# Patient Record
Sex: Female | Born: 1990 | Race: White | Hispanic: No | Marital: Single | Smoking: Never smoker
Health system: Southern US, Community
[De-identification: ages and names within clinical notes are randomized; demographics above are authoritative.]

## PROBLEM LIST (undated history)

## (undated) DIAGNOSIS — G43909 Migraine, unspecified, not intractable, without status migrainosus: Secondary | ICD-10-CM

## (undated) HISTORY — PX: ARTHROSCOPIC REPAIR ACL: SUR80

## (undated) HISTORY — PX: NO PAST SURGERIES: SHX2092

---
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
## 2013-03-05 ENCOUNTER — Ambulatory Visit: Payer: Managed Care, Other (non HMO) | Attending: Orthopedic Surgery | Admitting: Physical Therapy

## 2013-03-05 DIAGNOSIS — R262 Difficulty in walking, not elsewhere classified: Secondary | ICD-10-CM | POA: Insufficient documentation

## 2013-03-05 DIAGNOSIS — M25669 Stiffness of unspecified knee, not elsewhere classified: Secondary | ICD-10-CM | POA: Insufficient documentation

## 2013-03-05 DIAGNOSIS — M25569 Pain in unspecified knee: Secondary | ICD-10-CM | POA: Insufficient documentation

## 2013-03-05 DIAGNOSIS — J45909 Unspecified asthma, uncomplicated: Secondary | ICD-10-CM | POA: Insufficient documentation

## 2013-03-05 DIAGNOSIS — IMO0001 Reserved for inherently not codable concepts without codable children: Secondary | ICD-10-CM | POA: Insufficient documentation

## 2013-03-12 ENCOUNTER — Ambulatory Visit: Payer: Managed Care, Other (non HMO) | Attending: Orthopedic Surgery | Admitting: Physical Therapy

## 2013-03-12 DIAGNOSIS — M25669 Stiffness of unspecified knee, not elsewhere classified: Secondary | ICD-10-CM | POA: Insufficient documentation

## 2013-03-12 DIAGNOSIS — R262 Difficulty in walking, not elsewhere classified: Secondary | ICD-10-CM | POA: Insufficient documentation

## 2013-03-12 DIAGNOSIS — M25569 Pain in unspecified knee: Secondary | ICD-10-CM | POA: Insufficient documentation

## 2013-03-12 DIAGNOSIS — IMO0001 Reserved for inherently not codable concepts without codable children: Secondary | ICD-10-CM | POA: Insufficient documentation

## 2013-03-14 ENCOUNTER — Encounter: Payer: Managed Care, Other (non HMO) | Admitting: Physical Therapy

## 2013-05-13 ENCOUNTER — Ambulatory Visit: Payer: Managed Care, Other (non HMO) | Attending: Orthopedic Surgery | Admitting: Physical Therapy

## 2013-05-13 DIAGNOSIS — IMO0001 Reserved for inherently not codable concepts without codable children: Secondary | ICD-10-CM | POA: Insufficient documentation

## 2013-05-13 DIAGNOSIS — M25569 Pain in unspecified knee: Secondary | ICD-10-CM | POA: Insufficient documentation

## 2014-08-20 ENCOUNTER — Other Ambulatory Visit (HOSPITAL_COMMUNITY)
Admission: RE | Admit: 2014-08-20 | Discharge: 2014-08-20 | Disposition: A | Discharge status: Home / Self Care | Payer: Managed Care, Other (non HMO) | Source: Ambulatory Visit | Attending: Family Medicine | Admitting: Family Medicine

## 2014-08-20 ENCOUNTER — Ambulatory Visit (INDEPENDENT_AMBULATORY_CARE_PROVIDER_SITE_OTHER): Payer: Managed Care, Other (non HMO) | Admitting: Family Medicine

## 2014-08-20 ENCOUNTER — Encounter: Payer: Self-pay | Admitting: Family Medicine

## 2014-08-20 VITALS — BP 128/76 | HR 85 | Ht 62.25 in | Wt 231.0 lb

## 2014-08-20 DIAGNOSIS — Z01419 Encounter for gynecological examination (general) (routine) without abnormal findings: Secondary | ICD-10-CM | POA: Insufficient documentation

## 2014-08-20 DIAGNOSIS — Z23 Encounter for immunization: Secondary | ICD-10-CM

## 2014-08-20 DIAGNOSIS — Z0000 Encounter for general adult medical examination without abnormal findings: Secondary | ICD-10-CM

## 2014-08-20 DIAGNOSIS — Z111 Encounter for screening for respiratory tuberculosis: Secondary | ICD-10-CM

## 2014-08-20 NOTE — Progress Notes (Signed)
  Subjective:     Ruth Dorsey is a 24 y.o. female and is here for a comprehensive physical exam. The patient reports problems - cough > 1 months.  sometimes dyr and cometime wet. no fever or chills. Had a lot of congestion at the beginning. That is better. No ST or ear pain. No allergy sxs.  No meds for it.   History   Social History  . Marital Status: Single    Spouse Name: N/A  . Number of Children: N/A  . Years of Education: N/A   Occupational History  . college student     Social History Main Topics  . Smoking status: Never Smoker   . Smokeless tobacco: Not on file  . Alcohol Use: No  . Drug Use: No  . Sexual Activity: No   Other Topics Concern  . Not on file   Social History Narrative   In college.    Health Maintenance  Topic Date Due  . HIV Screening  10/19/2007  . PAP SMEAR  10/19/2010  . TETANUS/TDAP  10/19/2011  . INFLUENZA VACCINE  10/06/2014    The following portions of the patient's history were reviewed and updated as appropriate: allergies, current medications, past family history, past medical history, past social history, past surgical history and problem list.  Review of Systems A comprehensive review of systems was negative.   Objective:    There were no vitals taken for this visit. General appearance: alert, cooperative and appears stated age Head: Normocephalic, without obvious abnormality, atraumatic Eyes: conj clear, EOMI, PEERLA Ears: normal TM's and external ear canals both ears Nose: Nares normal. Septum midline. Mucosa normal. No drainage or sinus tenderness. Throat: lips, mucosa, and tongue normal; teeth and gums normal Neck: no adenopathy, no carotid bruit, no JVD, supple, symmetrical, trachea midline and thyroid not enlarged, symmetric, no tenderness/mass/nodules Back: symmetric, no curvature. ROM normal. No CVA tenderness. Lungs: clear to auscultation bilaterally Breasts: normal appearance, no masses or tenderness Heart: regular  rate and rhythm, S1, S2 normal, no murmur, click, rub or gallop Abdomen: soft, non-tender; bowel sounds normal; no masses,  no organomegaly Pelvic: cervix normal in appearance, external genitalia normal, no adnexal masses or tenderness, no cervical motion tenderness, rectovaginal septum normal, uterus normal size, shape, and consistency and vagina normal without discharge Extremities: extremities normal, atraumatic, no cyanosis or edema Pulses: 2+ and symmetric Skin: Skin color, texture, turgor normal. No rashes or lesions Lymph nodes: Cervical, supraclavicular, and axillary nodes normal. Neurologic: Alert and oriented X 3, normal strength and tone. Normal symmetric reflexes. Normal coordination and gait    Assessment:    Healthy female exam.      Plan:     See After Visit Summary for Counseling Recommendations   Keep up a regular exercise program and make sure you are eating a healthy diet Try to eat 4 servings of dairy a day, or if you are lactose intolerant take a calcium with vitamin D daily.  Your vaccines are up to date.   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  Will call with pap report.   Vision abnormal - recommend refer to optometry. She plans to schedule.

## 2014-08-21 LAB — CYTOLOGY - PAP

## 2014-08-22 LAB — TB SKIN TEST
INDURATION: 0 mm
TB SKIN TEST: NEGATIVE

## 2014-08-22 NOTE — Progress Notes (Signed)
Quick Note:  Call patient: Your Pap smear is normal. Repeat in 3 years. ______ 

## 2014-10-20 ENCOUNTER — Ambulatory Visit (INDEPENDENT_AMBULATORY_CARE_PROVIDER_SITE_OTHER): Payer: Managed Care, Other (non HMO) | Admitting: Family Medicine

## 2014-10-20 ENCOUNTER — Ambulatory Visit (INDEPENDENT_AMBULATORY_CARE_PROVIDER_SITE_OTHER): Payer: Managed Care, Other (non HMO)

## 2014-10-20 ENCOUNTER — Encounter: Payer: Self-pay | Admitting: Family Medicine

## 2014-10-20 VITALS — BP 138/64 | HR 90 | Ht 62.25 in | Wt 234.0 lb

## 2014-10-20 DIAGNOSIS — M25572 Pain in left ankle and joints of left foot: Secondary | ICD-10-CM

## 2014-10-20 DIAGNOSIS — S82892A Other fracture of left lower leg, initial encounter for closed fracture: Secondary | ICD-10-CM | POA: Diagnosis not present

## 2014-10-20 NOTE — Progress Notes (Signed)
   Subjective:    Patient ID: Ruth Dorsey, female    DOB: Jun 02, 1990, 24 y.o.   MRN: [id number]  HPI  Left ankle pain x 2 days after mis-stepped off a curb. Started swelling pretty quickly. Was able to bear wait initially but then got worse and then wasn't able to walk on it later.  Has been icing and elevated.  Taking Advil for pain relief. She is using some old crutches.   Review of Systems     Objective:   Physical Exam  Musculoskeletal:  Left ankle is swollen medially and laterally. 1+ edema. She's very tender over the posterior edge of the lateral malleolus. Mildly tender over the anterior edge of the lateral malleolus. Some discomfort with anterior drawer. Nontender over the top of the foot. Ankle range of motion is fairly normal though slightly decreased because of swelling. Strength is 5 out of 5 in all directions and great toe strength is 5 out of 5 with flexion and extension.   She does have a small scab on the top of her foot from the injury.       Assessment & Plan:  Left ankle pain-sprain versus fracture. Will get x-rays and call with results once available. For now continue to use crutches. Recommended Ace wrap for compression. Continue to elevate and ice as needed and will call with results once available.

## 2014-10-21 ENCOUNTER — Encounter: Payer: Self-pay | Admitting: Family Medicine

## 2014-10-21 ENCOUNTER — Ambulatory Visit (INDEPENDENT_AMBULATORY_CARE_PROVIDER_SITE_OTHER): Payer: Managed Care, Other (non HMO) | Admitting: Family Medicine

## 2014-10-21 DIAGNOSIS — S82892A Other fracture of left lower leg, initial encounter for closed fracture: Secondary | ICD-10-CM

## 2014-10-21 NOTE — Progress Notes (Signed)
   Subjective:    Patient ID: Ruth Dorsey, female    DOB: 12/16/1990, 24 y.o.   MRN: [id number]  HPI Patient was supposed to come in to be fitted for an ASO. But she says she's really not able to use her crutches and would prefer a boot. Ruth Dorsey was seen for boot placement on left foot.   Ruth Lecher, MD   Review of Systems     Objective:   Physical Exam        Assessment & Plan:  Boot fit well.

## 2014-10-31 ENCOUNTER — Ambulatory Visit: Payer: Managed Care, Other (non HMO) | Admitting: Family Medicine

## 2014-11-04 ENCOUNTER — Ambulatory Visit (INDEPENDENT_AMBULATORY_CARE_PROVIDER_SITE_OTHER): Payer: Managed Care, Other (non HMO)

## 2014-11-04 ENCOUNTER — Ambulatory Visit (INDEPENDENT_AMBULATORY_CARE_PROVIDER_SITE_OTHER): Payer: Managed Care, Other (non HMO) | Admitting: Family Medicine

## 2014-11-04 ENCOUNTER — Encounter: Payer: Self-pay | Admitting: Family Medicine

## 2014-11-04 VITALS — BP 120/79 | HR 73 | Wt 230.0 lb

## 2014-11-04 DIAGNOSIS — S82892G Other fracture of left lower leg, subsequent encounter for closed fracture with delayed healing: Secondary | ICD-10-CM | POA: Diagnosis not present

## 2014-11-04 DIAGNOSIS — M25572 Pain in left ankle and joints of left foot: Secondary | ICD-10-CM | POA: Diagnosis not present

## 2014-11-04 NOTE — Progress Notes (Signed)
   Subjective:    Patient ID: Ruth Dorsey, female    DOB: Sep 10, 1990, 24 y.o.   MRN: [id number]  HPI  F/u avulsion fracture of left lateral malleolus.  Has been in cam wwalker boot x 2 weeks. Went without one day and it became very painful and swollen. She's been wearing it since then. She is able to bear weight but just for short period of time. She has not needed to take anything for pain relief.   Review of Systems     Objective:   Physical Exam  Constitutional: She is oriented to person, place, and time. She appears well-developed and well-nourished.  HENT:  Head: Normocephalic and atraumatic.  Musculoskeletal:  Left ankle with some mild swelling laterally. She's tender over the posterior edge of the lateral malleolus. Nontender over the metatarsal heads. Fairly normal range of motion though some discomfort. She's very tender over the medial malleolus. Dorsal pedal pulses 2+. She does have some bruising at the top of the foot.  Neurological: She is alert and oriented to person, place, and time.  Skin: Skin is warm and dry.  Psychiatric: She has a normal mood and affect. Her behavior is normal.          Assessment & Plan:  Avulsion fracture, left lateral malleolus-she is still having significant pain and swelling. Will get repeat x-ray today since she's also extremely tender over the medial malleolus. We'll call results once available. If normal then expect that she will probably need to be in the boot for another 2-3 weeks.

## 2014-11-06 ENCOUNTER — Ambulatory Visit: Payer: Managed Care, Other (non HMO) | Admitting: Family Medicine

## 2014-12-17 ENCOUNTER — Encounter: Payer: Self-pay | Admitting: Family Medicine

## 2014-12-17 ENCOUNTER — Ambulatory Visit (INDEPENDENT_AMBULATORY_CARE_PROVIDER_SITE_OTHER): Payer: Managed Care, Other (non HMO) | Admitting: Family Medicine

## 2014-12-17 VITALS — BP 148/83 | HR 78 | Temp 99.2°F | Wt 229.0 lb

## 2014-12-17 DIAGNOSIS — J0301 Acute recurrent streptococcal tonsillitis: Secondary | ICD-10-CM | POA: Diagnosis not present

## 2014-12-17 DIAGNOSIS — J029 Acute pharyngitis, unspecified: Secondary | ICD-10-CM

## 2014-12-17 DIAGNOSIS — J039 Acute tonsillitis, unspecified: Secondary | ICD-10-CM | POA: Insufficient documentation

## 2014-12-17 LAB — POCT RAPID STREP A (OFFICE): RAPID STREP A SCREEN: NEGATIVE

## 2014-12-17 MED ORDER — AMOXICILLIN 500 MG PO CAPS: 500.0000 mg | ORAL_CAPSULE | Freq: Two times a day (BID) | ORAL | Status: DC

## 2014-12-17 NOTE — Patient Instructions (Signed)
Thank you for coming in today. Call or go to the emergency room if you get worse, have trouble breathing, have chest pains, or palpitations.   Tonsillitis Tonsillitis is an infection of the throat that causes the tonsils to become red, tender, and swollen. Tonsils are collections of lymphoid tissue at the back of the throat. Each tonsil has crevices (crypts). Tonsils help fight nose and throat infections and keep infection from spreading to other parts of the body for the first 18 months of life.  CAUSES Sudden (acute) tonsillitis is usually caused by infection with streptococcal bacteria. Long-lasting (chronic) tonsillitis occurs when the crypts of the tonsils become filled with pieces of food and bacteria, which makes it easy for the tonsils to become repeatedly infected. SYMPTOMS  Symptoms of tonsillitis include:  A sore throat, with possible difficulty swallowing.  White patches on the tonsils.  Fever.  Tiredness.  New episodes of snoring during sleep, when you did not snore before.  Small, foul-smelling, yellowish-white pieces of material (tonsilloliths) that you occasionally cough up or spit out. The tonsilloliths can also cause you to have bad breath. DIAGNOSIS Tonsillitis can be diagnosed through a physical exam. Diagnosis can be confirmed with the results of lab tests, including a throat culture. TREATMENT  The goals of tonsillitis treatment include the reduction of the severity and duration of symptoms and prevention of associated conditions. Symptoms of tonsillitis can be improved with the use of steroids to reduce the swelling. Tonsillitis caused by bacteria can be treated with antibiotic medicines. Usually, treatment with antibiotic medicines is started before the cause of the tonsillitis is known. However, if it is determined that the cause is not bacterial, antibiotic medicines will not treat the tonsillitis. If attacks of tonsillitis are severe and frequent, your health care  provider may recommend surgery to remove the tonsils (tonsillectomy). HOME CARE INSTRUCTIONS   Rest as much as possible and get plenty of sleep.  Drink plenty of fluids. While the throat is very sore, eat soft foods or liquids, such as sherbet, soups, or instant breakfast drinks.  Eat frozen ice pops.  Gargle with a warm or cold liquid to help soothe the throat. Mix 1/4 teaspoon of salt and 1/4 teaspoon of baking soda in 8 oz of water. SEEK MEDICAL CARE IF:   Large, tender lumps develop in your neck.  A rash develops.  A green, yellow-brown, or bloody substance is coughed up.  You are unable to swallow liquids or food for 24 hours.  You notice that only one of the tonsils is swollen. SEEK IMMEDIATE MEDICAL CARE IF:   You develop any new symptoms such as vomiting, severe headache, stiff neck, chest pain, or trouble breathing or swallowing.  You have severe throat pain along with drooling or voice changes.  You have severe pain, unrelieved with recommended medications.  You are unable to fully open the mouth.  You develop redness, swelling, or severe pain anywhere in the neck.  You have a fever. MAKE SURE YOU:   Understand these instructions.  Will watch your condition.  Will get help right away if you are not doing well or get worse.   This information is not intended to replace advice given to you by your health care provider. Make sure you discuss any questions you have with your health care provider.   Document Released: 12/01/2004 Document Revised: 03/14/2014 Document Reviewed: 08/10/2012 Elsevier Interactive Patient Education [employer].

## 2014-12-17 NOTE — Progress Notes (Signed)
Ruth Dorsey is a 24 y.o. female who presents to [facility]  today for sore throat and swollen tonsils. 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 She has tried OTC Tylenol for pain relief which she says helps. Additionally she states she thinks she has a had a low grade fever with some joint pain and sinus pressure. She denies chills, headache, cough, shortness of breath, chest pain, nausea, vomiting, diarrhea, constipation or urinary changes.    Past Medical History  Diagnosis Date  . Asthma    Past Surgical History  Procedure Laterality Date  . No past surgeries     Social History  Substance Use Topics  . Smoking status: Never Smoker   . Smokeless tobacco: Not on file  . Alcohol Use: No   family history includes Alcohol abuse in an other family member; Breast cancer in her maternal grandmother and paternal grandmother; Diabetes in her paternal aunt; Heart attack in her paternal grandfather; Hyperlipidemia in her father; Hypertension in her father; Stroke in her maternal uncle.  ROS as above Medications: Current Outpatient Prescriptions  Medication Sig Dispense Refill  . albuterol (PROVENTIL HFA;VENTOLIN HFA) 108 (90 BASE) MCG/ACT inhaler Inhale 2 puffs into the lungs every 6 (six) hours as needed.    Marland Kitchen amoxicillin (AMOXIL) 500 MG capsule Take 1 capsule (500 mg total) by mouth 2 (two) times daily. 20 capsule 0   No current facility-administered medications for this visit.   No Known Allergies   Exam:  BP 148/83 mmHg  Pulse 78  Temp(Src) 99.2 F (37.3 C) (Oral)  Wt 229 lb (103.874 kg)  SpO2 100% Gen: WDWN female in no acute distress. Non-toxic appearing HEENT: EOMI,  MMM. Tonsils mildly enlarged bilaterally with exudate present on the right side. No sinus tenderness on palpation. No  lymphadenopathy on palpation.  Lungs: Normal work of breathing. CTABL Heart: RRR no MRG Abd: NABS, Soft. Nondistended, Nontender Exts: Brisk capillary refill, warm and well perfused.   Results for orders placed or performed in visit on 12/17/14 (from the past 24 hour(s))  POCT rapid strep A     Status: None   Collection Time: 12/17/14  3:40 PM  Result Value Ref Range   Rapid Strep A Screen Negative Negative    Assessment: 1. Tonsillitis likely caused by streptococcus based on patient h/o of symptoms, patient proximity to ill contacts and physical exam findings of mild tonsillar enlargement with exudate without fever, lymphadenopathy, cough or adventitious lung sounds. Strep A r/o with rapid strep screen but suspect another strain of streptococcus is causative agent.   Plan: 1. Patient prescribed amoxicillin 500 mg capsules PO BID x 10 days and instructed to maintain at home regimen of tylenol for pain control. Warning signs and syndromes discussed. Patient conveyed understanding. See discharge instructions.

## 2015-01-07 ENCOUNTER — Ambulatory Visit (INDEPENDENT_AMBULATORY_CARE_PROVIDER_SITE_OTHER): Payer: Managed Care, Other (non HMO) | Admitting: Osteopathic Medicine

## 2015-01-07 VITALS — BP 133/65 | HR 67 | Temp 98.4°F | Wt 232.8 lb

## 2015-01-07 DIAGNOSIS — J029 Acute pharyngitis, unspecified: Secondary | ICD-10-CM

## 2015-01-07 MED ORDER — AMOXICILLIN 500 MG PO CAPS: 500.0000 mg | ORAL_CAPSULE | Freq: Two times a day (BID) | ORAL | Status: DC

## 2015-01-07 NOTE — Progress Notes (Signed)
HPI: Ruth Dorsey is a 24 y.o. female who presents to [facility]  today for chief complaint of:  Chief Complaint  Patient presents with  . Sore Throat  . Fatigue    . Location: THROAT . Quality: sore, scratchy . Severity: worse earlier but better today (made appt 2 days ago) . Duration: 3 days . Timing: constant . Context: recent amox tx for pharyngitis, seems to have strep frequently, . Modifying factors: no OTC med . Assoc signs/symptoms: no fever/chills   Past medical, social and family history reviewed: Past Medical History  Diagnosis Date  . Asthma    Past Surgical History  Procedure Laterality Date  . No past surgeries     Social History  Substance Use Topics  . Smoking status: Never Smoker   . Smokeless tobacco: Not on file  . Alcohol Use: No   Family History  Problem Relation Age of Onset  . Alcohol abuse      parent  . Heart attack Paternal Grandfather   . Diabetes Paternal Aunt   . Hyperlipidemia Father   . Hypertension Father   . Stroke Maternal Uncle   . Breast cancer Paternal Grandmother   . Breast cancer Maternal Grandmother     Current Outpatient Prescriptions  Medication Sig Dispense Refill  . albuterol (PROVENTIL HFA;VENTOLIN HFA) 108 (90 BASE) MCG/ACT inhaler Inhale 2 puffs into the lungs every 6 (six) hours as needed.    Marland Kitchen amoxicillin (AMOXIL) 500 MG capsule Take 1 capsule (500 mg total) by mouth 2 (two) times daily. (Patient not taking: Reported on 01/07/2015) 20 capsule 0   No current facility-administered medications for this visit.   No Known Allergies    Review of Systems: CONSTITUTIONAL:  No  fever, no chills, No  unintentional weight changes HEAD/EYES/EARS/NOSE/THROAT: No headache, no vision change, no hearing change, Yes  sore throat CARDIAC: No chest pain, no pressure/palpitations, no orthopnea RESPIRATORY: Yes  cough, No  shortness of breath/wheeze GASTROINTESTINAL: No nausea, no vomiting,  no abdominal pain,   Exam:  BP 133/65 mmHg  Pulse 67  Temp(Src) 98.4 F (36.9 C)  Wt 232 lb 12 oz (105.575 kg) Constitutional: VSS, see above. General Appearance: alert, well-developed, well-nourished, NAD Eyes: Normal lids and conjunctive, non-icteric sclera, PERRLA Ears, Nose, Mouth, Throat: Normal external inspection ears/nares/mouth/lips/gums, TM normal bilaterally, MMM, posterior pharynx Yes  erythema No  exudate Neck: No masses, trachea midline. No thyroid enlargement/tenderness/mass appreciated. No lymphadenopathy, tonsils enlarged Respiratory: Normal respiratory effort. no wheeze, no rhonchi, no rales Cardiovascular: S1/S2 normal, no murmur, no rub/gallop auscultated. RRR.    No results found for this or any previous visit (from the past 72 hour(s)).    ASSESSMENT/PLAN:  Acute pharyngitis, unspecified etiology - Plan: amoxicillin (AMOXIL) 500 MG capsule, Upper Respiratory Culture - unlikely strep on clinical exam, pt is feeling better today, but given recurrence will obtain culture to confirm no uncommon bacterial cause, consider referral to ENT if recurs again. Pt to fill abx if no better by the weekend.    Return if symptoms worsen or fail to improve.

## 2015-01-07 NOTE — Patient Instructions (Signed)
START ANTIBIOTICS IF YOU AREN'T FEELING ANY BETTER BY THE WEEKEND, OTHERWISE SUPPORTIVE CARE, VER-THE-COUNTER TREATMENT WITH DECONGESTANTS, ANTIHISTAMINE, HONEY OR LOZENGES FOR SORE THROAT/COUGHING.WE WILL CALL WITH RESULTS OF CULTURE, BUT THIS MAY TAKE SEVERAL DAYS FOR A RESULT TO BE REPORTED. LET us KNOW IF THERE IS ANYTHING ELSE WE CAN DO TO HELP!

## 2015-01-10 LAB — CULTURE, UPPER RESPIRATORY: Organism ID, Bacteria: NORMAL

## 2015-04-13 ENCOUNTER — Encounter: Payer: Self-pay | Admitting: Family Medicine

## 2015-04-13 ENCOUNTER — Ambulatory Visit (INDEPENDENT_AMBULATORY_CARE_PROVIDER_SITE_OTHER): Payer: Managed Care, Other (non HMO) | Admitting: Family Medicine

## 2015-04-13 VITALS — BP 139/71 | HR 103 | Temp 98.3°F | Wt 237.2 lb

## 2015-04-13 DIAGNOSIS — J029 Acute pharyngitis, unspecified: Secondary | ICD-10-CM

## 2015-04-13 DIAGNOSIS — J020 Streptococcal pharyngitis: Secondary | ICD-10-CM | POA: Diagnosis not present

## 2015-04-13 LAB — POCT RAPID STREP A (OFFICE): RAPID STREP A SCREEN: POSITIVE — AB

## 2015-04-13 MED ORDER — PENICILLIN G BENZATHINE 1200000 UNIT/2ML IM SUSP
1.2000 10*6.[IU] | Freq: Once | INTRAMUSCULAR | Status: AC
  Administered 2015-04-13: 1.2 10*6.[IU] via INTRAMUSCULAR

## 2015-04-13 NOTE — Patient Instructions (Signed)

## 2015-04-13 NOTE — Progress Notes (Signed)
   Subjective:    Patient ID: Ruth Dorsey, female    DOB: 1990-11-30, 25 y.o.   MRN: [id number]  HPI ST x 1 days. Painful to swallow. Feels swollen. Has a mild cough.  Having some post nasal drip.  Mild HA.  No fever today.  + exposure to strep throat.     Review of Systems     Objective:   Physical Exam  Constitutional: She is oriented to person, place, and time. She appears well-developed and well-nourished.  HENT:  Head: Normocephalic and atraumatic.  Right Ear: External ear normal.  Left Ear: External ear normal.  Nose: Nose normal.  Mouth/Throat: Oropharynx is clear and moist.  TMs and canals are clear.   Eyes: Conjunctivae and EOM are normal. Pupils are equal, round, and reactive to light.  Neck: Neck supple. No thyromegaly present.  Cardiovascular: Normal rate, regular rhythm and normal heart sounds.   Pulmonary/Chest: Effort normal and breath sounds normal. She has no wheezes.  Lymphadenopathy:    She has no cervical adenopathy.  Neurological: She is alert and oriented to person, place, and time.  Skin: Skin is warm and dry.  Psychiatric: She has a normal mood and affect. Her behavior is normal.          Assessment & Plan:  Pharyngitis - step is positive. Will give 1.2 mill unit Bicillin.  Get new toothbrush in 48-72 hours. F/U if not better in 5 days.

## 2015-06-05 ENCOUNTER — Encounter: Payer: Self-pay | Admitting: Family Medicine

## 2015-06-05 ENCOUNTER — Ambulatory Visit (INDEPENDENT_AMBULATORY_CARE_PROVIDER_SITE_OTHER): Payer: Managed Care, Other (non HMO) | Admitting: Family Medicine

## 2015-06-05 VITALS — BP 144/81 | HR 73 | Wt 237.0 lb

## 2015-06-05 DIAGNOSIS — R51 Headache: Secondary | ICD-10-CM | POA: Diagnosis not present

## 2015-06-05 DIAGNOSIS — R519 Headache, unspecified: Secondary | ICD-10-CM

## 2015-06-05 MED ORDER — PROPRANOLOL HCL 20 MG PO TABS: 20.0000 mg | ORAL_TABLET | Freq: Three times a day (TID) | ORAL | Status: DC

## 2015-06-05 NOTE — Progress Notes (Signed)
CC: Ruth Dorsey is a 25 y.o. female is here for Headache   Subjective: HPI:  Complains of daily headache present for the past month both at work and at home. It's localized in the right temple, constant without any pulsatile quality. Nothing seems to make it worse and nothing has made it better. It fluctuates throughout the day for mild to moderate degree. No benefit from ibuprofen or Tylenol. She denies photophobia and nausea or vision disturbance. She denies fevers, chills or nasal congestion. No confusion, head trauma, coordination difficulty or any other motor or sensory disturbances.   Review Of Systems Outlined In HPI  Past Medical History  Diagnosis Date  . Asthma     Past Surgical History  Procedure Laterality Date  . No past surgeries     Family History  Problem Relation Age of Onset  . Alcohol abuse      parent  . Heart attack Paternal Grandfather   . Diabetes Paternal Aunt   . Hyperlipidemia Father   . Hypertension Father   . Stroke Maternal Uncle   . Breast cancer Paternal Grandmother   . Breast cancer Maternal Grandmother     Social History   Social History  . Marital Status: Single    Spouse Name: N/A  . Number of Children: N/A  . Years of Education: N/A   Occupational History  . Teacher      [employer].     Social History Main Topics  . Smoking status: Never Smoker   . Smokeless tobacco: Not on file  . Alcohol Use: No  . Drug Use: 2.00 per week  . Sexual Activity: Not Currently   Other Topics Concern  . Not on file   Social History Narrative   Walks her dog.no regular caffeine.       Objective: BP 144/81 mmHg  Pulse 73  Wt 237 lb (107.502 kg)  General: Alert and Oriented, No Acute Distress HEENT: Pupils equal, round, reactive to light. Conjunctivae clear.  External ears unremarkable, canals clear with intact TMs with appropriate landmarks.  Middle ear appears open without effusion. Pink inferior turbinates.  Moist mucous membranes,  pharynx without inflammation nor lesions.  Neck supple without palpable lymphadenopathy nor abnormal masses. Neuro: CN II-XII grossly intact, full strength/rom of all four extremities, C5/L4/S1 DTRs 2/4 bilaterally, gait normal, rapid alternating movements normal, heel-shin test normal, Rhomberg normal. Lungs: Clear comfortable work of breathing  Cardiac: Regular rate and rhythm   Extremities: No peripheral edema.  Strong peripheral pulses.  Mental Status: No depression, anxiety, nor agitation. Skin: Warm and dry.  Assessment & Plan: Ruth Dorsey was seen today for headache.  Diagnoses and all orders for this visit:  Acute nonintractable headache, unspecified headache type -     propranolol (INDERAL) 20 MG tablet; Take 1 tablet (20 mg total) by mouth 3 (three) times daily.   Given elevated blood pressure trial of propranolol and if no better after one week please call me. If beneficial and follow-up with PCP within one month. Keeping pseudotumor cerebri on the differential however her presentation is currently lacking additional typical features.  Return if symptoms worsen or fail to improve.

## 2016-05-02 ENCOUNTER — Telehealth: Payer: Self-pay

## 2016-05-02 NOTE — Telephone Encounter (Signed)
She needs an appt.  This is not normal.  If happens again then needs to go to the ED.

## 2016-05-03 ENCOUNTER — Encounter: Payer: Self-pay | Admitting: Family Medicine

## 2016-05-03 ENCOUNTER — Other Ambulatory Visit: Payer: Self-pay | Admitting: Family Medicine

## 2016-05-03 ENCOUNTER — Ambulatory Visit (INDEPENDENT_AMBULATORY_CARE_PROVIDER_SITE_OTHER): Payer: Managed Care, Other (non HMO) | Admitting: Family Medicine

## 2016-05-03 VITALS — BP 130/71 | HR 76 | Ht 62.25 in | Wt 238.0 lb

## 2016-05-03 DIAGNOSIS — R5383 Other fatigue: Secondary | ICD-10-CM

## 2016-05-03 DIAGNOSIS — R110 Nausea: Secondary | ICD-10-CM | POA: Diagnosis not present

## 2016-05-03 DIAGNOSIS — R55 Syncope and collapse: Secondary | ICD-10-CM

## 2016-05-03 DIAGNOSIS — D473 Essential (hemorrhagic) thrombocythemia: Secondary | ICD-10-CM | POA: Diagnosis not present

## 2016-05-03 DIAGNOSIS — R413 Other amnesia: Secondary | ICD-10-CM

## 2016-05-03 DIAGNOSIS — E611 Iron deficiency: Secondary | ICD-10-CM

## 2016-05-03 DIAGNOSIS — R7989 Other specified abnormal findings of blood chemistry: Secondary | ICD-10-CM

## 2016-05-03 LAB — CBC WITH DIFFERENTIAL/PLATELET
Basophils Absolute: 0 {cells}/uL (ref 0–200)
Basophils Relative: 0 %
Eosinophils Absolute: 240 {cells}/uL (ref 15–500)
Eosinophils Relative: 2 %
HCT: 40.4 % (ref 35.0–45.0)
Hemoglobin: 13 g/dL (ref 11.7–15.5)
Lymphocytes Relative: 27 %
Lymphs Abs: 3240 {cells}/uL (ref 850–3900)
MCH: 25.4 pg — ABNORMAL LOW (ref 27.0–33.0)
MCHC: 32.2 g/dL (ref 32.0–36.0)
MCV: 79.1 fL — ABNORMAL LOW (ref 80.0–100.0)
MPV: 9.6 fL (ref 7.5–12.5)
Monocytes Absolute: 840 {cells}/uL (ref 200–950)
Monocytes Relative: 7 %
Neutro Abs: 7680 {cells}/uL (ref 1500–7800)
Neutrophils Relative %: 64 %
Platelets: 582 K/uL — ABNORMAL HIGH (ref 140–400)
RBC: 5.11 MIL/uL — ABNORMAL HIGH (ref 3.80–5.10)
RDW: 15.1 % — ABNORMAL HIGH (ref 11.0–15.0)
WBC: 12 K/uL — ABNORMAL HIGH (ref 3.8–10.8)

## 2016-05-03 NOTE — Progress Notes (Signed)
Subjective:    CC: Syncope  HPI:  26 yo female comes in today for a recent ED visit for syncope.  Mom called yesterday, "Fumi was found unconscious at school this morning around 7:15. Her roommate called 911 and she was taken to [facility]. They ran numerous test, and she was talking to mom at the hospital and acting "normal". She brought her home and Conni slept for about 4 hours. When she woke up, she thought it was Sunday, and did not remember anything".  I had asked mom to bring her in. The temporary memory loss was concerning.   Patient says her roommate found her around 6:30 AM.  She actually also reports amnesia. She says she doesn't remember anything from the night before this happened up until about 4:30 in the afternoon yesterday. She does report that she's been experiencing some nausea without vomiting, fatigue and headache for about a week before this happened. She has not been sick recently. The headache she describes as a pressure sensation around her whole head with a little bit more pressure on the right side of her head. She denies any urinary symptoms. No blood in the urine or stool. She's not had any abdominal pain or discomfort. She's actually been eating normally. No fevers chills or sweats.    Labs, CT, shoulder xray and chest xray done at [facility].   They did x-ray her right shoulder because she complained of pain in the emergency room. Today she says it is a little bit sore. She also has a small bruise on her mid chest where her roommate was trying to do CPR on her.  Past medical history, Surgical history, Family history not pertinant except as noted below, Social history, Allergies, and medications have been entered into the medical record, reviewed, and corrections made.   Review of Systems: No fevers, chills, night sweats, weight loss, chest pain, or shortness of breath.   Objective:    General: Well Developed, well nourished, and in no acute distress.   Neuro: Alert and oriented x3, extra-ocular muscles intact, sensation grossly intact.  HEENT: Normocephalic, atraumatic  Skin: Warm and dry, no rashes. Cardiac: Regular rate and rhythm, no murmurs rubs or gallops, no lower extremity edema.  Respiratory: Clear to auscultation bilaterally. Not using accessory muscles, speaking in full sentences. Abd: Soft, just mildly tender left of the umbilicus. No rebound or guarding. No masses.   Impression and Recommendations:    Syncope - Unclear etiology at this point. She did have a normal head CT. I am concerned that she has a maternal uncle who had a stroke in his 74s and another maternal uncle who has history of repeat DVTs. I'm going to go ahead and do a hypercoagulable panel on her. I also like to get her scheduled for an echocardiogram just to rule out hypertrophic cardiomyopathy. Would also like to get a bubble study done  Elevated platelet levels-we'll recheck those today.  Right shoulder pain-most likely just contusion from the fall itself.  Call if not improving.  Nausea-unclear etiology. She's not really had any vomiting with it and does have a little bit of tenderness on abdominal exam but no red flags. And have her do some stool cards just to make sure there is no sign of blood loss through the stool. We'll also do a urinalysis today.

## 2016-05-03 NOTE — Patient Instructions (Signed)
Do not drive for one week.

## 2016-05-04 ENCOUNTER — Other Ambulatory Visit: Payer: Self-pay | Admitting: Family Medicine

## 2016-05-04 DIAGNOSIS — R82998 Other abnormal findings in urine: Secondary | ICD-10-CM

## 2016-05-04 LAB — URINALYSIS
Bilirubin Urine: NEGATIVE
Glucose, UA: NEGATIVE
Hgb urine dipstick: NEGATIVE
KETONES UR: NEGATIVE
NITRITE: NEGATIVE
Protein, ur: NEGATIVE
SPECIFIC GRAVITY, URINE: 1.023 (ref 1.001–1.035)
pH: 6 (ref 5.0–8.0)

## 2016-05-04 LAB — IRON AND TIBC
%SAT: 6 % — ABNORMAL LOW (ref 11–50)
IRON: 24 ug/dL — AB (ref 40–190)
TIBC: 386 ug/dL (ref 250–450)
UIBC: 362 ug/dL (ref 125–400)

## 2016-05-04 LAB — VITAMIN B12: VITAMIN B 12: 318 pg/mL (ref 200–1100)

## 2016-05-04 LAB — TSH: TSH: 1.62 m[IU]/L

## 2016-05-04 LAB — FERRITIN: FERRITIN: 26 ng/mL (ref 10–154)

## 2016-05-04 NOTE — Telephone Encounter (Signed)
Patient evaluated in office 

## 2016-05-05 ENCOUNTER — Telehealth: Payer: Self-pay | Admitting: Family Medicine

## 2016-05-05 LAB — POC HEMOCCULT BLD/STL (HOME/3-CARD/SCREEN)
Card #2 Fecal Occult Blod, POC: NEGATIVE
FECAL OCCULT BLD: NEGATIVE
Fecal Occult Blood, POC: NEGATIVE

## 2016-05-05 NOTE — Telephone Encounter (Signed)
Received information from [facility], unable to run Hypercoagulable panel, comprehensive due to incorrect frozen specimen tube sent. Will route to PCP.   Did place complaint with Ulice Dash from [place], advised we have had 4 similar [facility] errors this week.

## 2016-05-05 NOTE — Addendum Note (Signed)
Addended by: Terance Hart on: 05/05/2016 04:17 PM   Modules accepted: Orders

## 2016-05-06 LAB — URINE CULTURE: ORGANISM ID, BACTERIA: NO GROWTH

## 2016-05-10 ENCOUNTER — Other Ambulatory Visit: Payer: Self-pay | Admitting: *Deleted

## 2016-05-10 DIAGNOSIS — E611 Iron deficiency: Secondary | ICD-10-CM

## 2016-05-10 NOTE — Addendum Note (Signed)
Addended by: Teddy Spike on: 05/10/2016 05:41 PM   Modules accepted: Orders

## 2016-05-11 ENCOUNTER — Ambulatory Visit (HOSPITAL_COMMUNITY)
Admission: RE | Admit: 2016-05-11 | Discharge: 2016-05-11 | Disposition: A | Discharge status: Home / Self Care | Payer: Managed Care, Other (non HMO) | Source: Ambulatory Visit | Attending: Family Medicine | Admitting: Family Medicine

## 2016-05-11 DIAGNOSIS — R5383 Other fatigue: Secondary | ICD-10-CM | POA: Insufficient documentation

## 2016-05-11 DIAGNOSIS — I351 Nonrheumatic aortic (valve) insufficiency: Secondary | ICD-10-CM | POA: Diagnosis not present

## 2016-05-11 DIAGNOSIS — R110 Nausea: Secondary | ICD-10-CM | POA: Insufficient documentation

## 2016-05-11 DIAGNOSIS — R55 Syncope and collapse: Secondary | ICD-10-CM | POA: Diagnosis not present

## 2016-05-11 LAB — ECHOCARDIOGRAM COMPLETE BUBBLE STUDY
AVLVOTPG: 11 mmHg
AVPHT: 423 ms
CHL CUP DOP CALC LVOT VTI: 30.3 cm
E decel time: 162 msec
E/e' ratio: 6.91
FS: 36 % (ref 28–44)
IVS/LV PW RATIO, ED: 0.95
LA diam index: 1.84 cm/m2
LA vol A4C: 40.7 ml
LA vol: 47.6 mL
LASIZE: 38 mm
LAVOLIN: 23.1 mL/m2
LDCA: 2.84 cm2
LEFT ATRIUM END SYS DIAM: 38 mm
LV E/e'average: 6.91
LV PW d: 8.25 mm — AB (ref 0.6–1.1)
LV TDI E'MEDIAL: 12.3
LVEEMED: 6.91
LVELAT: 17.8 cm/s
LVOT SV: 86 mL
LVOT peak vel: 169 cm/s
LVOTD: 19 mm
MV Dec: 162
MV Peak grad: 6 mmHg
MV pk E vel: 123 m/s
MVPKAVEL: 66.7 m/s
TAPSE: 26.4 mm
TDI e' lateral: 17.8

## 2016-05-11 NOTE — Progress Notes (Signed)
  Echocardiogram 2D Echocardiogram has been performed with bubble study.  Ruth Dorsey 05/11/2016, 4:07 PM

## 2016-05-17 LAB — HYPERCOAGULABLE PANEL, COMPREHENSIVE
Anticardiolipin IgA: <11 [APL'U]
Anticardiolipin IgG: <14 [GPL'U]
Anticardiolipin IgM: <12 [MPL'U]
Beta-2 Glyco I IgG: 9 SGU (ref <=20)
Beta-2-Glycoprotein I IgM: <9 SMU (ref <=20)

## 2016-05-17 LAB — RFX PTT-LA W/RFX TO HEX PHASE CONF

## 2016-05-17 LAB — RFX DRVVT SCR W/RFLX CONF 1:1 MIX

## 2016-05-18 ENCOUNTER — Observation Stay (HOSPITAL_COMMUNITY)
Admission: EM | Admit: 2016-05-18 | Discharge: 2016-05-19 | Disposition: A | Discharge status: Home / Self Care | Payer: Managed Care, Other (non HMO) | Attending: Internal Medicine | Admitting: Internal Medicine

## 2016-05-18 ENCOUNTER — Observation Stay (HOSPITAL_COMMUNITY): Payer: Managed Care, Other (non HMO)

## 2016-05-18 ENCOUNTER — Encounter (HOSPITAL_COMMUNITY): Payer: Self-pay

## 2016-05-18 DIAGNOSIS — G43909 Migraine, unspecified, not intractable, without status migrainosus: Secondary | ICD-10-CM | POA: Diagnosis not present

## 2016-05-18 DIAGNOSIS — R55 Syncope and collapse: Principal | ICD-10-CM | POA: Diagnosis present

## 2016-05-18 DIAGNOSIS — G43809 Other migraine, not intractable, without status migrainosus: Secondary | ICD-10-CM

## 2016-05-18 DIAGNOSIS — J45909 Unspecified asthma, uncomplicated: Secondary | ICD-10-CM | POA: Diagnosis not present

## 2016-05-18 HISTORY — DX: Migraine, unspecified, not intractable, without status migrainosus: G43.909

## 2016-05-18 LAB — BASIC METABOLIC PANEL
Anion gap: 7 (ref 5–15)
BUN: 12 mg/dL (ref 6–20)
CHLORIDE: 106 mmol/L (ref 101–111)
CO2: 25 mmol/L (ref 22–32)
Calcium: 9.2 mg/dL (ref 8.9–10.3)
Creatinine, Ser: 0.89 mg/dL (ref 0.44–1.00)
GFR calc non Af Amer: >60 mL/min (ref >60)
GLUCOSE: 103 mg/dL — AB (ref 65–99)
Potassium: 3.9 mmol/L (ref 3.5–5.1)
Sodium: 138 mmol/L (ref 135–145)

## 2016-05-18 LAB — URINALYSIS, ROUTINE W REFLEX MICROSCOPIC
BILIRUBIN URINE: NEGATIVE
Glucose, UA: NEGATIVE mg/dL
Hgb urine dipstick: NEGATIVE
Ketones, ur: NEGATIVE mg/dL
Nitrite: NEGATIVE
Protein, ur: NEGATIVE mg/dL
SPECIFIC GRAVITY, URINE: 1.012 (ref 1.005–1.030)
pH: 6 (ref 5.0–8.0)

## 2016-05-18 LAB — CBG MONITORING, ED: GLUCOSE-CAPILLARY: 100 mg/dL — AB (ref 65–99)

## 2016-05-18 LAB — PROTIME-INR
INR: 0.98
PROTHROMBIN TIME: 13 s (ref 11.4–15.2)

## 2016-05-18 LAB — CBC
HCT: 39.4 % (ref 36.0–46.0)
Hemoglobin: 12.8 g/dL (ref 12.0–15.0)
MCH: 26 pg (ref 26.0–34.0)
MCHC: 32.5 g/dL (ref 30.0–36.0)
MCV: 80.1 fL (ref 78.0–100.0)
PLATELETS: 499 10*3/uL — AB (ref 150–400)
RBC: 4.92 MIL/uL (ref 3.87–5.11)
RDW: 14.4 % (ref 11.5–15.5)
WBC: 12.4 10*3/uL — ABNORMAL HIGH (ref 4.0–10.5)

## 2016-05-18 LAB — I-STAT BETA HCG BLOOD, ED (MC, WL, AP ONLY)

## 2016-05-18 LAB — ANTITHROMBIN III: ANTITHROMB III FUNC: 106 % (ref 75–120)

## 2016-05-18 LAB — APTT: aPTT: 27 seconds (ref 24–36)

## 2016-05-18 LAB — D-DIMER, QUANTITATIVE: D-Dimer, Quant: <0.27 ug/mL-FEU (ref 0.00–0.50)

## 2016-05-18 MED ORDER — GADOBENATE DIMEGLUMINE 529 MG/ML IV SOLN
20.0000 mL | Freq: Once | INTRAVENOUS | Status: AC
  Administered 2016-05-18: 20 mL via INTRAVENOUS

## 2016-05-18 MED ORDER — ENOXAPARIN SODIUM 60 MG/0.6ML ~~LOC~~ SOLN
50.0000 mg | SUBCUTANEOUS | Status: DC
  Administered 2016-05-18: 50 mg via SUBCUTANEOUS
  Filled 2016-05-18: qty 0.6

## 2016-05-18 MED ORDER — PROCHLORPERAZINE EDISYLATE 5 MG/ML IJ SOLN
10.0000 mg | Freq: Once | INTRAMUSCULAR | Status: AC
  Administered 2016-05-18: 10 mg via INTRAVENOUS
  Filled 2016-05-18: qty 2

## 2016-05-18 MED ORDER — ONDANSETRON HCL 4 MG/2ML IJ SOLN: 4.0000 mg | Freq: Once | INTRAMUSCULAR | Status: DC

## 2016-05-18 MED ORDER — SODIUM CHLORIDE 0.9 % IV BOLUS (SEPSIS)
1000.0000 mL | Freq: Once | INTRAVENOUS | Status: AC
  Administered 2016-05-18: 1000 mL via INTRAVENOUS

## 2016-05-18 MED ORDER — SODIUM CHLORIDE 0.9% FLUSH: 3.0000 mL | Freq: Two times a day (BID) | INTRAVENOUS | Status: DC

## 2016-05-18 MED ORDER — KETOROLAC TROMETHAMINE 30 MG/ML IJ SOLN
30.0000 mg | Freq: Once | INTRAMUSCULAR | Status: AC
  Administered 2016-05-18: 30 mg via INTRAVENOUS
  Filled 2016-05-18: qty 1

## 2016-05-18 MED ORDER — ENOXAPARIN SODIUM 40 MG/0.4ML ~~LOC~~ SOLN: 40.0000 mg | SUBCUTANEOUS | Status: DC

## 2016-05-18 MED ORDER — DIPHENHYDRAMINE HCL 50 MG/ML IJ SOLN
25.0000 mg | Freq: Once | INTRAMUSCULAR | Status: AC
  Administered 2016-05-18: 25 mg via INTRAVENOUS
  Filled 2016-05-18: qty 1

## 2016-05-18 MED ORDER — HYDROMORPHONE HCL 1 MG/ML IJ SOLN: 1.0000 mg | Freq: Once | INTRAMUSCULAR | Status: DC

## 2016-05-18 MED ORDER — TRAMADOL HCL 50 MG PO TABS: 50.0000 mg | ORAL_TABLET | Freq: Four times a day (QID) | ORAL | Status: DC | PRN

## 2016-05-18 MED ORDER — ACETAMINOPHEN 500 MG PO TABS
1000.0000 mg | ORAL_TABLET | Freq: Three times a day (TID) | ORAL | Status: DC | PRN
  Administered 2016-05-19: 1000 mg via ORAL
  Filled 2016-05-18 (×2): qty 2

## 2016-05-18 MED ORDER — ACETAMINOPHEN 650 MG RE SUPP: 650.0000 mg | Freq: Four times a day (QID) | RECTAL | Status: DC | PRN

## 2016-05-18 MED ORDER — ALBUTEROL SULFATE (2.5 MG/3ML) 0.083% IN NEBU: 2.5000 mg | INHALATION_SOLUTION | Freq: Four times a day (QID) | RESPIRATORY_TRACT | Status: DC | PRN

## 2016-05-18 MED ORDER — ACETAMINOPHEN 325 MG PO TABS: 650.0000 mg | ORAL_TABLET | Freq: Four times a day (QID) | ORAL | Status: DC | PRN

## 2016-05-18 NOTE — Progress Notes (Signed)
Ruth Dorsey notified that pt's family requesting that pt have labs drawn via IV site because "that's what they've been doing".  Will update family and follow orders.

## 2016-05-18 NOTE — ED Triage Notes (Signed)
PER EMS: pt brought in from work, student reports he saw her fall to the ground with loss of consciousness. EMS arrived to find the patient conscious and alert but was confused, shivering and not able to follow commands at that time. No hx of seizures but EMS reports a similar episode last week and was brought here. A&Ox4 at this time. BP-156/90, HR-88, CBG-98, O2-98% RA. Memory impairment.

## 2016-05-18 NOTE — H&P (Addendum)
History and Physical    Ruth Dorsey ATF:[id number] DOB: 1990/12/15 DOA: 05/18/2016  Referring MD/NP/PA: pa PCP: [name], MD Outpatient Specialists:  Patient coming from: home  Chief Complaint: found face down  HPI: Ruth Dorsey is a 26 y.o. female with medical history significant of migraines.  Patient has  Had two episodes of being found face down in the last month.  Patient is unable to recall events surround episodes.  First, was early AM when patient had gotten up to walk her dog.  Roommate found her face down in kitchen.  No facial trauma.  Patient went to [facility] where CT scan and labs done.  Patient was back to normal upon discharge from [facility].  She ate at [employer] a with parents and then took a nap with her mom.  4 hours later she woke up and was combative and confused and could not remember the prior episodes.  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  No facial trauma.  She was reported to be pale/face hot/below waist was cold per mother.  No loss of bowel function, no loss of urine, no tongue biting or seizure like activity.  In between these episodes, her PCP did an further work up involving a hypercoagulable panel (was sent incorrectly) and an echo.  Which was normal.  Patient denied drug use to me (parents in room but for admitting nurse says she uses 2x/week.) No neck stiffnes, no dysuria, no fever, no cough, no chest pain  In the ER, labs were done that were unremarkable.  Vitals were unremarkable except a slight elevation of her WBCs.  EKG was normal.   Family very worried about stroke as there is a family history of this.     Review of Systems: all systems reviewed, negative unless stated above in HPI   Past Medical History:  Diagnosis Date  . Asthma   . Migraines     Past Surgical History:  Procedure Laterality Date  . NO PAST SURGERIES        reports that she has never smoked. She has never used smokeless tobacco. She reports that she uses drugs about 2 times per week. She reports that she does not drink alcohol.  No Known Allergies  Family History  Problem Relation Age of Onset  . Alcohol abuse      parent  . Heart attack Paternal Grandfather   . Diabetes Paternal Aunt   . Hyperlipidemia Father   . Hypertension Father   . Stroke Maternal Uncle   . Breast cancer Paternal Grandmother   . Breast cancer Maternal Grandmother      Prior to Admission medications   Medication Sig Start Date End Date Taking? Authorizing Provider  albuterol (PROVENTIL HFA;VENTOLIN HFA) 108 (90 BASE) MCG/ACT inhaler Inhale 2 puffs into the lungs every 6 (six) hours as needed.    Historical Provider, MD  propranolol (INDERAL) 20 MG tablet Take 1 tablet (20 mg total) by mouth 3 (three) times daily. 06/05/15   Marcial Pacas, DO    Physical Exam: Vitals:   05/18/16 1442 05/18/16 1500 05/18/16 1708 05/18/16 1730  BP: 139/67 133/71  132/74  Pulse: 76 95  99  Resp: 16 19  22   Temp: 100 F (37.8 C)  99 F (37.2 C)   TempSrc: Oral  Oral   SpO2: 99% 99%  98%  Weight:  Height:          Constitutional: NAD, calm, comfortable Vitals:   05/18/16 1442 05/18/16 1500 05/18/16 1708 05/18/16 1730  BP: 139/67 133/71  132/74  Pulse: 76 95  99  Resp: 16 19  22   Temp: 100 F (37.8 C)  99 F (37.2 C)   TempSrc: Oral  Oral   SpO2: 99% 99%  98%  Weight:      Height:       Eyes: PERRL, lids and conjunctivae normal ENMT: Mucous membranes are moist. Posterior pharynx clear of any exudate or lesions.Normal dentition-- no facial trauma Neck: normal, supple, no masses, no thyromegaly Respiratory: clear to auscultation bilaterally, no wheezing, no crackles. Normal respiratory effort. No accessory muscle use.  Cardiovascular: Regular rate and rhythm, no murmurs / rubs / gallops. No extremity edema. 2+ pedal pulses. No carotid bruits.  Abdomen: no  tenderness, no masses palpated. No hepatosplenomegaly. Bowel sounds positive.  Musculoskeletal: no clubbing / cyanosis. No joint deformity upper and lower extremities. Good ROM, no contractures. Normal muscle tone.  Skin: no rashes, lesions, ulcers. No induration Neurologic: CN 2-12 grossly intact. Sensation intact, DTR normal. Strength 5/5 in all 4.  Psychiatric: Normal judgment and insight. Alert and oriented x 3. Normal mood.     Labs on Admission: I have personally reviewed following labs and imaging studies  CBC:  Recent Labs Lab 05/18/16 1443  WBC 12.4*  HGB 12.8  HCT 39.4  MCV 80.1  PLT 335*   Basic Metabolic Panel:  Recent Labs Lab 05/18/16 1443  NA 138  K 3.9  CL 106  CO2 25  GLUCOSE 103*  BUN 12  CREATININE 0.89  CALCIUM 9.2   GFR: Estimated Creatinine Clearance: 111.4 mL/min (by C-G formula based on SCr of 0.89 mg/dL). Liver Function Tests: No results for input(s): AST, ALT, ALKPHOS, BILITOT, PROT, ALBUMIN in the last 168 hours. No results for input(s): LIPASE, AMYLASE in the last 168 hours. No results for input(s): AMMONIA in the last 168 hours. Coagulation Profile: No results for input(s): INR, PROTIME in the last 168 hours. Cardiac Enzymes: No results for input(s): CKTOTAL, CKMB, CKMBINDEX, TROPONINI in the last 168 hours. BNP (last 3 results) No results for input(s): PROBNP in the last 8760 hours. HbA1C: No results for input(s): HGBA1C in the last 72 hours. CBG:  Recent Labs Lab 05/18/16 1448  GLUCAP 100*   Lipid Profile: No results for input(s): CHOL, HDL, LDLCALC, TRIG, CHOLHDL, LDLDIRECT in the last 72 hours. Thyroid Function Tests: No results for input(s): TSH, T4TOTAL, FREET4, T3FREE, THYROIDAB in the last 72 hours. Anemia Panel: No results for input(s): VITAMINB12, FOLATE, FERRITIN, TIBC, IRON, RETICCTPCT in the last 72 hours. Urine analysis:    Component Value Date/Time   COLORURINE YELLOW 05/18/2016 [place]  05/18/2016 1548   LABSPEC 1.012 05/18/2016 1548   PHURINE 6.0 05/18/2016 1548   GLUCOSEU NEGATIVE 05/18/2016 1548   [place] 05/18/2016 [place] 05/18/2016 [place] 05/18/2016 1548   PROTEINUR NEGATIVE 05/18/2016 1548   NITRITE NEGATIVE 05/18/2016 1548   LEUKOCYTESUR TRACE (A) 05/18/2016 1548   Sepsis Labs: Invalid input(s): PROCALCITONIN, LACTICIDVEN No results found for this or any previous visit (from the past 240 hour(s)).   Radiological Exams on Admission: No results found.  EKG: Independently reviewed. Sinus   Assessment/Plan Active Problems:   Syncope   Migraines   Syncope -patient found on face each time which causes worry for seizures vs  heart arrhythmia but oddly has not had ANY facial trauma- ? factious  -tele -MRI with and without contrast -echo done by PCP -UDS -EEG -will re-order hypercoagulable panel (wanted by PCP) -may need neurology consult vs cardiology consult in AM if any concerning findings -d dimer - ordered in ER- pending-- if highly elevated would get CTA of chest -B12 lower end of normal -TSH -check orthostatics  Migraines -was on propanolol but has not been taking   DVT prophylaxis: lovenox Code Status: full Family Communication: parents at bedside Disposition Plan:  Consults called:  Admission status: obs-tele   [place] DO [facility] Pager [phone]  If 7PM-7AM, please contact night-coverage [email or web address] Password [facility]  05/18/2016, 6:31 PM

## 2016-05-18 NOTE — ED Notes (Signed)
Patient transported to MRI 

## 2016-05-18 NOTE — ED Provider Notes (Signed)
[facility] DEPT Provider Note   CSN: [id number] Arrival date & time: 05/18/16  1435     History   Chief Complaint Chief Complaint  Patient presents with  . Loss of Consciousness    HPI Ruth Dorsey is a 26 y.o. female with pertinent past medical history of asthma and migraines presents to the emergency department by EMS after unwitnessed event of LOC associated with constant, "all over", pressure like headache PTA while at work. Patient's mother at bedside tells most of the history. Reportedly patient was at work when she was found by a coworker facedown on the ground unconscious, she eventually woke up by EMS and patient was unable to recall what happened. 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 Patient denies preceding prodromal symptoms including blurred vision, nausea, lightheadedness, chest pain, shortness of breath or diaphoresis. Patient denies facial trauma after fall. Patient denies postictal confusion, tongue biting, bladder incontinence, numbness/tingling/weakness to her extremities. No visual changes.    Reportedly, patient had a similar syncopal event on 2/26. Patient was found unconscious by her roommate who called 911, patient was taken to [facility] emergency department. I reviewed patient's chart and documents from [facility]. It appears patient had a normal chest x-ray, CT of the head and face, CBC.  Patient was discharged and advised to follow up with primary care provider. Per patient's mother, patient was acting "normal" while at [facility] ED. When discharged patient went home and took a nap and slept for 4 hours, upon waking up patient did not remember anything or her hospital/emergency department stay. Patient was evaluated by PCP on 05/03/2016 ordered lab work. CBC, Hemoccult, urinalysis, B12, ferritin, TSH and echocardiogram studies were all normal.  Patient denies known history of DVT/PE, is not on oral estrogen, no  tobacco use. Mother at bedside tells me that patient's uncle had a stroke at age 53 and another family member has "a clotting disorder".  HPI  Past Medical History:  Diagnosis Date  . Asthma   . Migraines     Patient Active Problem List   Diagnosis Date Noted  . Syncope 05/18/2016  . Migraines 05/18/2016  . Asthma 05/17/2011    Past Surgical History:  Procedure Laterality Date  . NO PAST SURGERIES      OB History    No data available       Home Medications    Prior to Admission medications   Medication Sig Start Date End Date Taking? Authorizing Provider  albuterol (PROVENTIL HFA;VENTOLIN HFA) 108 (90 BASE) MCG/ACT inhaler Inhale 2 puffs into the lungs every 6 (six) hours as needed.    Historical Provider, MD  propranolol (INDERAL) 20 MG tablet Take 1 tablet (20 mg total) by mouth 3 (three) times daily. 06/05/15   Marcial Pacas, DO    Family History Family History  Problem Relation Age of Onset  . Alcohol abuse      parent  . Heart attack Paternal Grandfather   . Diabetes Paternal Aunt   . Hyperlipidemia Father   . Hypertension Father   . Stroke Maternal Uncle   . Breast cancer Paternal Grandmother   . Breast cancer Maternal Grandmother     Social History Social History  Substance Use Topics  . Smoking status: Never Smoker  . Smokeless tobacco: Never Used  . Alcohol use No     Allergies   Patient has no known allergies.   Review of Systems Review of Systems  Constitutional: Negative  for appetite change, chills and fever.  HENT: Negative for congestion and sore throat.   Eyes: Negative for visual disturbance.  Respiratory: Negative for cough, choking, chest tightness and shortness of breath.   Cardiovascular: Negative for chest pain, palpitations and leg swelling.  Gastrointestinal: Negative for abdominal pain, anal bleeding, constipation, diarrhea, nausea and vomiting.  Genitourinary: Negative for difficulty urinating and hematuria.    Musculoskeletal: Negative for arthralgias.  Skin: Negative for rash and wound.  Neurological: Positive for syncope and headaches. Negative for dizziness, seizures, weakness, light-headedness and numbness.  Hematological: Does not bruise/bleed easily.  Psychiatric/Behavioral: Negative.        Amnesia      Physical Exam Updated Vital Signs BP (!) 120/51 (BP Location: Left Arm)   Pulse 84   Temp 98.4 F (36.9 C) (Oral)   Resp (!) 29   Ht 5\' 2"  (1.575 m)   Wt 104.3 kg   LMP 05/11/2016   SpO2 98%   BMI 42.07 kg/m   Physical Exam  Constitutional: She is oriented to person, place, and time. She appears well-developed and well-nourished. She is cooperative. She is easily aroused.  Non-toxic appearance. No distress.  HENT:  Head: Normocephalic and atraumatic. Head is without contusion.  Right Ear: Tympanic membrane normal.  Left Ear: Tympanic membrane normal.  Nose: Nose normal. No nose lacerations, sinus tenderness, nasal deformity or nasal septal hematoma.  Mouth/Throat: Oropharynx is clear and moist and mucous membranes are normal. No lacerations. No oropharyngeal exudate, posterior oropharyngeal edema, posterior oropharyngeal erythema or tonsillar abscesses.  No perioral or intraoral trauma, no sign of lateral tongue biting or bleeding  Eyes: Conjunctivae, EOM and lids are normal. Pupils are equal, round, and reactive to light. Right eye exhibits no nystagmus. Left eye exhibits no nystagmus.  Neck: Normal range of motion and full passive range of motion without pain. Neck supple. No JVD present. No neck rigidity. Normal range of motion present. No Brudzinski's sign and no Kernig's sign noted.  Cardiovascular: Normal rate, regular rhythm, S1 normal, S2 normal, normal heart sounds and intact distal pulses.   No murmur heard. Pulses:      Carotid pulses are 2+ on the right side, and 2+ on the left side.      Dorsalis pedis pulses are 2+ on the right side, and 2+ on the left side.   Pulmonary/Chest: Effort normal and breath sounds normal. No respiratory distress. She has no wheezes. She has no rales. She exhibits no tenderness.  Abdominal: Soft. Bowel sounds are normal. She exhibits no distension and no mass. There is no tenderness. There is no rebound and no guarding.  Musculoskeletal: Normal range of motion. She exhibits no deformity.  Lymphadenopathy:    She has no cervical adenopathy.  Neurological: She is alert, oriented to person, place, and time and easily aroused. She has normal strength. No cranial nerve deficit or sensory deficit. She displays a negative Romberg sign.  Reflex Scores:      Patellar reflexes are 2+ on the right side and 2+ on the left side. Pt is alert and oriented.   Speech and phonation normal.   Thought process coherent.   Strength 5/5 in upper and lower extremities.   Sensation to light touch intact in upper and lower extremities.  Gait normal.   Negative Romberg. No leg drift.  Intact finger to nose test. CN I not tested CN II full visual fields  CN III, IV, VI PEERL and EOM intact CN V light touch intact  in all 3 divisions of trigeminal nerve CN VII facial nerve movements intact, symmetric CN VIII hearing intact to finger rub CN IX, X no uvula deviation, symmetric soft palate rise CN XI 5/5 SCM and trapezius strength  CN XII Tongue midline with symmetric L/R movement  Skin: Skin is warm and dry. Capillary refill takes less than 2 seconds.  Psychiatric: She has a normal mood and affect. Her behavior is normal. Judgment and thought content normal.  Nursing note and vitals reviewed.    ED Treatments / Results  Labs (all labs ordered are listed, but only abnormal results are displayed) Labs Reviewed  BASIC METABOLIC PANEL - Abnormal; Notable for the following:       Result Value   Glucose, Bld 103 (*)    All other components within normal limits  CBC - Abnormal; Notable for the following:    WBC 12.4 (*)    Platelets 499 (*)     All other components within normal limits  URINALYSIS, ROUTINE W REFLEX MICROSCOPIC - Abnormal; Notable for the following:    Leukocytes, UA TRACE (*)    Bacteria, UA RARE (*)    Squamous Epithelial / LPF 0-5 (*)    All other components within normal limits  CBG MONITORING, ED - Abnormal; Notable for the following:    Glucose-Capillary 100 (*)    All other components within normal limits  D-DIMER, QUANTITATIVE (NOT AT [facility])  PROTIME-INR  APTT  RAPID URINE DRUG SCREEN, HOSP PERFORMED  ANTITHROMBIN III  PROTEIN C ACTIVITY  PROTEIN C, TOTAL  PROTEIN S ACTIVITY  PROTEIN S, TOTAL  LUPUS ANTICOAGULANT PANEL  BETA-2-GLYCOPROTEIN I ABS, IGG/M/A  HOMOCYSTEINE  FACTOR 5 LEIDEN  PROTHROMBIN GENE MUTATION  CARDIOLIPIN ANTIBODIES, IGG, IGM, IGA  I-STAT BETA HCG BLOOD, ED ([facility], [facility], [facility] ONLY)    EKG  EKG Interpretation  Date/Time:  Wednesday May 18 2016 14:40:10 EDT Ventricular Rate:  90 PR Interval:    QRS Duration: 93 QT Interval:  357 QTC Calculation: 437 R Axis:   31 Text Interpretation:  Sinus rhythm Confirmed by Alvino Chapel  MD, Ovid Curd [phone]) on 05/18/2016 4:02:06 PM       Radiology No results found.  Procedures Procedures (including critical care time)  Medications Ordered in ED Medications  sodium chloride 0.9 % bolus 1,000 mL (1,000 mLs Intravenous New Bag/Given 05/18/16 1732)  prochlorperazine (COMPAZINE) injection 10 mg (10 mg Intravenous Given 05/18/16 1733)  diphenhydrAMINE (BENADRYL) injection 25 mg (25 mg Intravenous Given 05/18/16 1732)  ketorolac (TORADOL) 30 MG/ML injection 30 mg (30 mg Intravenous Given 05/18/16 1732)  gadobenate dimeglumine (MULTIHANCE) injection 20 mL (20 mLs Intravenous Contrast Given 05/18/16 1926)     Initial Impression / Assessment and Plan / ED Course  I have reviewed the triage vital signs and the nursing notes.  Pertinent labs & imaging results that were available during my care of the patient were reviewed by me and considered in  my medical decision making (see chart for details).  Clinical Course as of May 18 1936  Wed May 18, 2016  1928 Temp: 99 F (37.2 C) [CG]  1928 Pulse Rate: 76 [CG]  1928 BP: 139/67 [CG]  1928 Resp: 16 [CG]  1928 SpO2: 99 % [CG]  1929 D-Dimer, Quant: <0.27 [CG]  1929 I-stat hCG, quantitative: <5.0 [CG]  1929 Glucose-Capillary: (!) 100 [CG]  1929 Non ischemic EKG ED EKG [CG]  1929 Nitrite: NEGATIVE [CG]    Clinical Course User Index [CG] Kinnie Feil, PA-C  26 year old female with pertinent past medical history of asthma and migraines presents to ED with unwitnessed episode of loss of consciousness today. This is patient's second episode of loss of consciousness in the last 2 weeks. Patient evaluated at [facility] ED on 05/02/16 for first episode of loss of consciousness, CT scan of the head and face, CBC, chest x-ray negative at that time. Patient follow-up with PCP on 05/03/16 who ordered lab work including CBC, Hemoccult, urinalysis, B12, ferritin, TSH and echocardiogram which resulted within normal limits. On exam patient is nontoxic appearing, arousable, complains of mild headache. Vital signs reassuring. Regular rate and rhythm without murmurs. Complete neurological assessment was normal today. D-dimer negative, hCG negative, not ischemic EKG.  Originally order CT scan of the head to rule out trauma from today's episode of loss of consciousness.  Requested admission for further workup and likely MRI. She given migraine cocktail for her current headache while pending workup.  Spoke to hospitalist Dr. Eliseo Squires on the phone who recommended I discontinue this order and order an MRI. Dr. Eliseo Squires will see patient in ED prior to admission.   Unclear etiology of loss of consciousness. Patient does currently report headache, different in quality compared to her usual migraine headaches. She states she's had this constant pressure-like allover headache for the past 2 weeks since her very first episode of  loss of consciousness. No other associated symptoms with this headache. Patient does not have prodromal symptoms or postictal type symptoms like tongue biting, confusion, bladder incontinence after episodes of loss of consciousness.   Final Clinical Impressions(s) / ED Diagnoses   Final diagnoses:  Syncope    New Prescriptions New Prescriptions   No medications on file     Arlean Hopping 05/18/16 1938    Milton Ferguson, MD 05/18/16 2207

## 2016-05-18 NOTE — ED Notes (Signed)
Pt returned from CT, admitting at bedside

## 2016-05-19 ENCOUNTER — Observation Stay (HOSPITAL_COMMUNITY): Payer: Managed Care, Other (non HMO)

## 2016-05-19 ENCOUNTER — Telehealth: Payer: Self-pay | Admitting: Family Medicine

## 2016-05-19 DIAGNOSIS — R55 Syncope and collapse: Secondary | ICD-10-CM | POA: Diagnosis not present

## 2016-05-19 LAB — CBC
HCT: 35.4 % — ABNORMAL LOW (ref 36.0–46.0)
Hemoglobin: 11.1 g/dL — ABNORMAL LOW (ref 12.0–15.0)
MCH: 25.2 pg — ABNORMAL LOW (ref 26.0–34.0)
MCHC: 31.4 g/dL (ref 30.0–36.0)
MCV: 80.5 fL (ref 78.0–100.0)
PLATELETS: 415 10*3/uL — AB (ref 150–400)
RBC: 4.4 MIL/uL (ref 3.87–5.11)
RDW: 14.3 % (ref 11.5–15.5)
WBC: 7.9 10*3/uL (ref 4.0–10.5)

## 2016-05-19 LAB — BASIC METABOLIC PANEL
ANION GAP: 7 (ref 5–15)
BUN: 14 mg/dL (ref 6–20)
CALCIUM: 8.8 mg/dL — AB (ref 8.9–10.3)
CO2: 25 mmol/L (ref 22–32)
CREATININE: 0.82 mg/dL (ref 0.44–1.00)
Chloride: 109 mmol/L (ref 101–111)
GFR calc non Af Amer: >60 mL/min (ref >60)
GLUCOSE: 91 mg/dL (ref 65–99)
Potassium: 4.6 mmol/L (ref 3.5–5.1)
Sodium: 141 mmol/L (ref 135–145)

## 2016-05-19 LAB — PROTEIN C ACTIVITY: PROTEIN C ACTIVITY: 142 % (ref 73–180)

## 2016-05-19 LAB — LUPUS ANTICOAGULANT PANEL
DRVVT: 37.3 s (ref 0.0–47.0)
PTT Lupus Anticoagulant: 29.1 s (ref 0.0–51.9)

## 2016-05-19 LAB — GLUCOSE, CAPILLARY: Glucose-Capillary: 84 mg/dL (ref 65–99)

## 2016-05-19 LAB — RAPID URINE DRUG SCREEN, HOSP PERFORMED
AMPHETAMINES: NOT DETECTED
BENZODIAZEPINES: NOT DETECTED
Barbiturates: NOT DETECTED
Cocaine: NOT DETECTED
Opiates: NOT DETECTED
Tetrahydrocannabinol: NOT DETECTED

## 2016-05-19 LAB — PROTEIN C, TOTAL: Protein C, Total: 93 % (ref 60–150)

## 2016-05-19 LAB — PROTEIN S, TOTAL: PROTEIN S AG TOTAL: 83 % (ref 60–150)

## 2016-05-19 LAB — TSH: TSH: 1.621 u[IU]/mL (ref 0.350–4.500)

## 2016-05-19 LAB — PROTEIN S ACTIVITY: PROTEIN S ACTIVITY: 100 % (ref 63–140)

## 2016-05-19 MED ORDER — IBUPROFEN 400 MG PO TABS: 400.0000 mg | ORAL_TABLET | Freq: Four times a day (QID) | ORAL | Status: DC | PRN

## 2016-05-19 MED ORDER — IBUPROFEN 400 MG PO TABS: 400.0000 mg | ORAL_TABLET | Freq: Four times a day (QID) | ORAL | 0 refills | Status: AC | PRN

## 2016-05-19 MED ORDER — BUTALBITAL-APAP-CAFFEINE 50-325-40 MG PO TABS: 1.0000 | ORAL_TABLET | ORAL | 0 refills | Status: DC | PRN

## 2016-05-19 MED ORDER — BUTALBITAL-APAP-CAFFEINE 50-325-40 MG PO TABS: 1.0000 | ORAL_TABLET | ORAL | Status: DC | PRN

## 2016-05-19 NOTE — Procedures (Signed)
ELECTROENCEPHALOGRAM REPORT  Date of Study: 05/19/2016  Patient's Name: Ruth Dorsey MRN: [id number] Date of Birth: December 17, 1990  Referring Provider: Dr. Eulogio Bear  Clinical History: This is a 26 year old woman with episodes of loss of consciousness.  Medications: acetaminophen (TYLENOL) tablet 1,000 mg  albuterol (PROVENTIL) (2.5 MG/3ML) 0.083% nebulizer solution 2.5 mg  enoxaparin (LOVENOX) injection 50 mg   Technical Summary: A multichannel digital EEG recording measured by the international 10-20 system with electrodes applied with paste and impedances below 5000 ohms performed in our laboratory with EKG monitoring in an awake and drowsy patient.  Hyperventilation and photic stimulation were performed.  The digital EEG was referentially recorded, reformatted, and digitally filtered in a variety of bipolar and referential montages for optimal display.    Description: The patient is awake and drowsy during the recording.  During maximal wakefulness, there is a symmetric, medium voltage 9 Hz posterior dominant rhythm that attenuates with eye opening.  The record is symmetric.  During drowsiness, there is an increase in theta slowing of the background.Deeper stages of sleep were not seen.  Hyperventilation and photic stimulation did not elicit any abnormalities.  There were no epileptiform discharges or electrographic seizures seen.  EKG artifact is seen throughout the recording.  EKG lead was unremarkable.  Impression: This awake and drowsy EEG is normal.    Clinical Correlation: A normal EEG does not exclude a clinical diagnosis of epilepsy. Clinical correlation is advised.   Ellouise Newer, M.D.

## 2016-05-19 NOTE — Telephone Encounter (Signed)
Pt's mother called to see if PCP would place an order for a sleep study based on Pt's episodes of syncope. Pt had another episode yesterday and was admitted for overnight observation at [facility] hospital. The hospitalist would like Pt to have a sleep study done, but feels it would get scheduled faster if ordered by PCP. Will route to PCP for review.

## 2016-05-19 NOTE — Progress Notes (Signed)
EEG Completed; Results Pending  

## 2016-05-19 NOTE — Progress Notes (Signed)
Ruth Dorsey to be D/C'd home per MD order.  Discussed with the patient and all questions fully answered.  VSS, Skin clean, dry and intact without evidence of skin break down, no evidence of skin tears noted. IV catheter discontinued intact. Site without signs and symptoms of complications. Dressing and pressure applied.  An After Visit Summary was printed and given to the patient. Patient received prescription.  D/c education completed with patient/family including follow up instructions, medication list, d/c activities limitations if indicated, with other d/c instructions as indicated by MD - patient able to verbalize understanding, all questions fully answered.   Patient instructed to return to ED, call 911, or call MD for any changes in condition.   Patient escorted via [place], and D/C home via private auto.  [name] 05/19/2016 6:20 PM

## 2016-05-20 ENCOUNTER — Other Ambulatory Visit: Payer: Self-pay | Admitting: Physician Assistant

## 2016-05-20 DIAGNOSIS — R55 Syncope and collapse: Secondary | ICD-10-CM

## 2016-05-20 LAB — BETA-2-GLYCOPROTEIN I ABS, IGG/M/A: Beta-2 Glyco I IgG: <9 GPI IgG units (ref 0–20)

## 2016-05-20 LAB — CARDIOLIPIN ANTIBODIES, IGG, IGM, IGA: Anticardiolipin IgG: <9 GPL U/mL (ref 0–14)

## 2016-05-20 NOTE — Telephone Encounter (Signed)
Ok to order sleep study. Please schedule at [facility]

## 2016-05-20 NOTE — Telephone Encounter (Signed)
Order placed. Pt notified. Hospitalist already placed order. Our order cancelled.

## 2016-05-23 ENCOUNTER — Ambulatory Visit (INDEPENDENT_AMBULATORY_CARE_PROVIDER_SITE_OTHER): Payer: Managed Care, Other (non HMO)

## 2016-05-23 DIAGNOSIS — R55 Syncope and collapse: Secondary | ICD-10-CM

## 2016-05-23 LAB — PROTHROMBIN GENE MUTATION

## 2016-05-23 LAB — FACTOR 5 LEIDEN

## 2016-05-23 NOTE — Discharge Summary (Addendum)
[facility] Discharge Summary   Patient: Ruth Dorsey TOI:[id number]   PCP: [name], MD DOB: January 17, 1991   Date of admission: 05/18/2016   Date of discharge: 05/19/2016    Discharge Diagnoses:  Active Problems:   Syncope   Migraines  Admitted From: home Disposition:  home  Recommendations for Outpatient Follow-up:  1. Follow-up with PCP and request for sleep study.   Follow-up Information    METHENEY,CATHERINE, MD. Schedule an appointment as soon as possible for a visit in 1 week(s).   Specialty:  Family Medicine Contact information: [address] [place] [address] [phone]          Diet recommendation: regular diet  Activity: The patient is advised to gradually reintroduce usual activities.  Discharge Condition: good  Code Status: Full code  History of present illness: As per the H and P dictated on admission, "Ruth Dorsey is a 26 y.o. female with medical history significant of migraines.  Patient has  Had two episodes of being found face down in the last month.  Patient is unable to recall events surround episodes.  First, was early AM when patient had gotten up to walk her dog.  Roommate found her face down in kitchen.  No facial trauma.  Patient went to [facility] where CT scan and labs done.  Patient was back to normal upon discharge from [place].  She ate at [employer] a with parents and then took a nap with her mom.  4 hours later she woke up and was combative and confused and could not remember the prior episodes.  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  No facial trauma.  She was reported to be pale/face hot/below waist was cold per mother.  No loss of bowel function, no loss of urine, no tongue biting or seizure like activity.  In between these episodes, her PCP did an further work up involving a hypercoagulable panel  (was sent incorrectly) and an echo.  Which was normal.  Patient denied drug use to me (parents in room but for admitting nurse says she uses 2x/week.) No neck stiffnes, no dysuria, no fever, no cough, no chest pain"  Hospital Course:  Summary of her active problems in the hospital is as following. Syncope -patient found on face each time which causes worry for seizures vs heart arrhythmia but oddly has not had ANY facial trauma- ? factious  -tele unremarkable -MRI with and without contrast unremarkable -echo done by PCP, discussed with cardiology, no concerns for AR causing syncopal presentation -UDS negative -EEG unremarkable -will re-order hypercoagulable panel (wanted by PCP) -Arrange outpatient 30 day monitoring. - Presentation highly suspicious for narcolepsy, recommend outpatient sleep study as well. -d dimer - ordered in ER- negative-- if highly elevated would get CTA of chest -B12 lower end of normal -TSH normal -Normal orthostatics  Migraines -was on propanolol but has not been taking, likely associated with sleep apnea, Fioricet and Motrin ordered.  All other chronic medical condition were stable during the hospitalization.  Patient was ambulatory without any assistance. On the day of the discharge the patient's vitals were stable, and no other acute medical condition were reported by patient. the patient was felt safe to be discharge at home with family.  Procedures and Results:  EEG  Impression: This awake and drowsy EEG is normal.    Consultations:  none  DISCHARGE MEDICATION: Discharge Medication List as of 05/19/2016  3:51 PM    START taking these medications   Details  butalbital-acetaminophen-caffeine (FIORICET, ESGIC) 50-325-40 MG tablet Take 1 tablet by mouth every 4 (four) hours as needed for headache., Starting Thu 05/19/2016, Print    ibuprofen (ADVIL,MOTRIN) 400 MG tablet Take 1 tablet (400 mg total) by mouth every 6 (six) hours as needed for  headache., Starting Thu 05/19/2016, Normal      CONTINUE these medications which have NOT CHANGED   Details  albuterol (PROVENTIL HFA;VENTOLIN HFA) 108 (90 BASE) MCG/ACT inhaler Inhale 2 puffs into the lungs every 6 (six) hours as needed., Historical Med      STOP taking these medications     propranolol (INDERAL) 20 MG tablet        No Known Allergies Discharge Instructions    Ambulatory referral to Sleep Studies    Complete by:  As directed    Diet - low sodium heart healthy    Complete by:  As directed    Discharge instructions    Complete by:  As directed    It is important that you read following instructions as well as go over your medication list with RN to help you understand your care after this hospitalization.  Discharge Instructions: Please follow-up with PCP in one week  Please request your primary care physician to go over all Hospital Tests and Procedure/Radiological results at the follow up,  Please get all Hospital records sent to your PCP by signing hospital release before you go home.   Do not drive, operating heavy machinery, perform activities at heights, swimming or participation in water activities or provide baby sitting services due to syncope; until you have been seen by Primary Care Physician or a Neurologist and advised to do so again. Do not take more than prescribed Pain, Sleep and Anxiety Medications. You were cared for by a hospitalist during your hospital stay. If you have any questions about your discharge medications or the care you received while you were in the hospital after you are discharged, you can call the unit and ask to speak with the hospitalist on call if the hospitalist that took care of you is not available.  Once you are discharged, your primary care physician will handle any further medical issues. Please note that NO REFILLS for any discharge medications will be authorized once you are discharged, as it is imperative that you return  to your primary care physician (or establish a relationship with a primary care physician if you do not have one) for your aftercare needs so that they can reassess your need for medications and monitor your lab values. You Must read complete instructions/literature along with all the possible adverse reactions/side effects for all the Medicines you take and that have been prescribed to you. Take any new Medicines after you have completely understood and accept all the possible adverse reactions/side effects. Wear Seat belts while driving. If you have smoked or chewed Tobacco in the last 2 yrs please stop smoking and/or stop any Recreational drug use.   Driving Restrictions    Complete by:  As directed    Do not drive until cleared by PCP.   Increase activity slowly    Complete by:  As directed      Discharge Exam: Filed Weights   05/18/16 1439 05/18/16 2029  Weight: 104.3 kg (230 lb) 108.3 kg (238 lb 11.2 oz)   Vitals:   05/19/16 0906 05/19/16 0911  BP: (!) 153/85 (!) 144/74  Pulse: 76 80  Resp:    Temp:     General: Appear in mild distress, no Rash; Oral Mucosa moist. Cardiovascular: S1 and S2 Present, no Murmur, no JVD Respiratory: Bilateral Air entry present and Clear to Auscultation, no Crackles, no wheezes Abdomen: Bowel Sound present, Soft and no tenderness Extremities: no Pedal edema, no calf tenderness Neurology: Grossly no focal neuro deficit.  The results of significant diagnostics from this hospitalization (including imaging, microbiology, ancillary and laboratory) are listed below for reference.    Significant Diagnostic Studies: Mr Jeri Cos Wo Contrast  Result Date: 05/18/2016 CLINICAL DATA:  26 y/o F; history of migraine headaches with multiple episodes found down without recollection of the events. EXAM: MRI HEAD WITHOUT AND WITH CONTRAST TECHNIQUE: Multiplanar, multiecho pulse sequences of the brain and surrounding structures were obtained without and with  intravenous contrast. CONTRAST:  19mL MULTIHANCE GADOBENATE DIMEGLUMINE 529 MG/ML IV SOLN COMPARISON:  None. FINDINGS: Brain: No acute infarction, hemorrhage, hydrocephalus, extra-axial collection or mass lesion. Normal midline structures including morphologically normal pituitary gland, complete corpus callosum, and complete vermis. No disorder of cortical formation, heterotopia, or dysplasia is identified. Normal myelination. Hippocampi are symmetric in size and signal. No abnormal enhancement of the brain. Vascular: Normal flow voids. Skull and upper cervical spine: Normal marrow signal. Sinuses/Orbits: Negative. Other: None. IMPRESSION: Normal MRI of the brain with and without intravenous contrast Electronically Signed   By: Kristine Garbe M.D.   On: 05/18/2016 19:47    Microbiology: No results found for this or any previous visit (from the past 240 hour(s)).   Labs: CBC:  Recent Labs Lab 05/18/16 1443 05/19/16 0553  WBC 12.4* 7.9  HGB 12.8 11.1*  HCT 39.4 35.4*  MCV 80.1 80.5  PLT 499* 428*   Basic Metabolic Panel:  Recent Labs Lab 05/18/16 1443 05/19/16 0553  NA 138 141  K 3.9 4.6  CL 106 109  CO2 25 25  GLUCOSE 103* 91  BUN 12 14  CREATININE 0.89 0.82  CALCIUM 9.2 8.8*   CBG:  Recent Labs Lab 05/18/16 1448 05/19/16 0559  GLUCAP 100* 84   Time spent: 50 minutes  Signed:  Temeca Somma  [facility] 05/19/2016 , 10:04 AM

## 2016-05-24 LAB — HOMOCYSTEINE: Homocysteine: 10.7 umol/L (ref 0.0–15.0)

## 2016-05-26 ENCOUNTER — Ambulatory Visit (INDEPENDENT_AMBULATORY_CARE_PROVIDER_SITE_OTHER): Payer: Managed Care, Other (non HMO) | Admitting: Family Medicine

## 2016-05-26 ENCOUNTER — Encounter: Payer: Self-pay | Admitting: Family Medicine

## 2016-05-26 VITALS — BP 139/61 | HR 98 | Ht 62.0 in | Wt 242.0 lb

## 2016-05-26 DIAGNOSIS — R55 Syncope and collapse: Secondary | ICD-10-CM | POA: Diagnosis not present

## 2016-05-26 DIAGNOSIS — R51 Headache: Secondary | ICD-10-CM

## 2016-05-26 DIAGNOSIS — I10 Essential (primary) hypertension: Secondary | ICD-10-CM | POA: Diagnosis not present

## 2016-05-26 DIAGNOSIS — G8929 Other chronic pain: Secondary | ICD-10-CM

## 2016-05-26 MED ORDER — BUTALBITAL-APAP-CAFFEINE 50-325-40 MG PO TABS: 1.0000 | ORAL_TABLET | ORAL | 1 refills | Status: AC | PRN

## 2016-05-26 NOTE — Progress Notes (Signed)
Subjective:    CC: Hospital follow-up  HPI:  26 year old female comes in today for hospital follow-up for a repeat syncopal episode. Please see previous office note. Initially she had a syncopal episode after walking her dog. And then had amnesia postevent for almost a day and a half. That was when I saw her at the initial time and she gone to local hospital at that time and had a negative workup. We kept her out of work for a few days to make sure that she was feeling better and then okay to return to work. Evidently she had a second episode recur worker found her face down slumped over a desk at work. Again patient doesn't remember this happening and doesn't remember any preceding events that may have warned her that she was going to pass out. She was admitted to the hospital the second time she is Artie had a negative echocardiogram with bubble study. She's also had a hypercoagulable workup which is also negative. Urine drug screen was negative as well. There does not seem to be any seizure-like associated activity. EEG was negative. Brain MRI was normal. Their suspicion is possible narcolepsy versus stress-induced. They're requesting that we get an outpatient sleep study.  She's also continued to have a chronic headache for the last 3 or 4 weeks. She even tried Fioricet. She says it helps a little but it doesn't completely go away. They tried to give her a cocktail for migraine in the hospital as well but she said she didn't like it because it made her feel like she had to move her legs constantly.  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  Interestingly both times that she was found down and she was told that her face looked pale and blue. This last time when EMS arrived her blood pressure was 200/100.  Past medical history, Surgical history, Family history not pertinant except as noted below, Social history, Allergies, and  medications have been entered into the medical record, reviewed, and corrections made.   Review of Systems: No fevers, chills, night sweats, weight loss, chest pain, or shortness of breath.   Objective:    General: Well Developed, well nourished, and in no acute distress.  Neuro: Alert and oriented x3, extra-ocular muscles intact, sensation grossly intact.  HEENT: Normocephalic, atraumatic  Skin: Warm and dry, no rashes. Cardiac: Regular rate and rhythm, no murmurs rubs or gallops, no lower extremity edema.  Respiratory: Clear to auscultation bilaterally. Not using accessory muscles, speaking in full sentences.   Impression and Recommendations:    Syncopal episode 2 - discussed stabilities. She is in the middle of a cardiac workup and has her 30 day heart monitor on. She has an appointment scheduled with Dr. for neurology for evaluation for sleep consultation for possible sleep apnea versus narcolepsy.  Since she also has been having these episodes of feeling hot and flushed periodically and sweaty I think it might be worth testing her for pheochromocytoma. This really could be causing some intermittent paroxysmal hypertension which could then lead to her actually passing out. Given lab slip today.  Headaches-I did go ahead and refill her Fioricet today. Unclear if the headaches are related to the episodes where she has fallen forward and may have had some mild head injury though she's had negative brain MRI, or if it could be related to the syncopal episodes themselves.

## 2016-05-29 LAB — METANEPHRINES, PLASMA
Metanephrine, Free: <25 pg/mL (ref <=57)
NORMETANEPHRINE FREE: 39 pg/mL (ref <=148)
TOTAL METANEPHRINES-PLASMA: 39 pg/mL (ref <=205)

## 2016-05-30 ENCOUNTER — Other Ambulatory Visit: Payer: Self-pay | Admitting: Family Medicine

## 2016-05-30 ENCOUNTER — Telehealth: Payer: Self-pay | Admitting: Emergency Medicine

## 2016-05-30 DIAGNOSIS — R55 Syncope and collapse: Secondary | ICD-10-CM

## 2016-05-30 LAB — CATECHOLAMINES, FRACTIONATED, URINE, 24 HOUR

## 2016-05-30 LAB — METANEPHRINES, URINE, 24 HOUR

## 2016-05-30 NOTE — Telephone Encounter (Signed)
Ok to place new order.

## 2016-05-30 NOTE — Telephone Encounter (Signed)
  [facility] called to say a re-order will be necessary when this patient turns in her 24 hour urine collection; the proper code for free cortisol is 82230.pak

## 2016-05-31 ENCOUNTER — Telehealth: Payer: Self-pay | Admitting: *Deleted

## 2016-05-31 ENCOUNTER — Ambulatory Visit: Payer: Managed Care, Other (non HMO) | Admitting: Family Medicine

## 2016-05-31 LAB — CATECHOLAMINES, FRACTIONATED, PLASMA
Catecholamines, Total: 377 pg/mL
Norepinephrine: 377 pg/mL

## 2016-05-31 NOTE — Telephone Encounter (Signed)
Order placed and sent to lab.

## 2016-05-31 NOTE — Telephone Encounter (Signed)
Called pt and lvm asking whether or not she has returned to work or when is she going back? Asked that she rtn call to give [place] this information.Ruth Dorsey

## 2016-06-01 ENCOUNTER — Encounter: Payer: Self-pay | Admitting: Neurology

## 2016-06-02 LAB — METANEPHRINES, URINE, 24 HOUR
Metaneph Total, Ur: 284 mcg/24 h (ref 94–604)
Metanephrines, Ur: 40 mcg/24 h (ref 25–222)
Normetanephrine, 24H Ur: 244 mcg/24 h (ref 40–412)

## 2016-06-07 LAB — CATECHOLAMINES, FRACTIONATED, URINE, 24 HOUR
CALCULATED TOTAL (E+ NE): 39 ug/(24.h) (ref 26–121)
Creatinine, Urine mg/day-CATEUR: 2 g/(24.h) (ref 0.63–2.50)
Dopamine, 24 hr Urine: 326 mcg/24 h (ref 52–480)
Norepinephrine, 24 hr Ur: 39 mcg/24 h (ref 15–100)
TOTAL VOLUME - CF 24HR U: 1500 mL

## 2016-06-08 ENCOUNTER — Telehealth: Payer: Self-pay

## 2016-06-08 NOTE — Telephone Encounter (Signed)
Ruth Dorsey called and said that she has not been checking her BP.  She says there have been several times that she has felt hot and she felt like it was high, but didn't think about taking it.

## 2016-06-09 ENCOUNTER — Institutional Professional Consult (permissible substitution): Payer: Managed Care, Other (non HMO) | Admitting: Neurology

## 2016-06-09 NOTE — Telephone Encounter (Signed)
Okay. Is she going to start checking it when she has symptoms? And then may be report back to [place].

## 2016-06-09 NOTE — Telephone Encounter (Signed)
Left message with instructions to take BP when having episode and to report the results to [place].

## 2016-06-28 ENCOUNTER — Encounter: Payer: Self-pay | Admitting: Neurology

## 2016-06-28 ENCOUNTER — Ambulatory Visit (INDEPENDENT_AMBULATORY_CARE_PROVIDER_SITE_OTHER): Payer: Managed Care, Other (non HMO) | Admitting: Neurology

## 2016-06-28 VITALS — BP 143/90 | HR 84 | Ht 62.0 in | Wt 240.0 lb

## 2016-06-28 DIAGNOSIS — R55 Syncope and collapse: Secondary | ICD-10-CM

## 2016-06-28 DIAGNOSIS — R400 Somnolence: Secondary | ICD-10-CM | POA: Diagnosis not present

## 2016-06-28 DIAGNOSIS — R51 Headache: Secondary | ICD-10-CM | POA: Diagnosis not present

## 2016-06-28 DIAGNOSIS — J4520 Mild intermittent asthma, uncomplicated: Secondary | ICD-10-CM

## 2016-06-28 DIAGNOSIS — R0683 Snoring: Secondary | ICD-10-CM

## 2016-06-28 DIAGNOSIS — R6889 Other general symptoms and signs: Secondary | ICD-10-CM

## 2016-06-28 DIAGNOSIS — R519 Headache, unspecified: Secondary | ICD-10-CM

## 2016-06-28 NOTE — Progress Notes (Signed)
Subjective:    Patient ID: Ruth Dorsey is a 26 y.o. female.  HPI     Star Age, MD, PhD [facility] [address], [address] [place], [place] [address]  I saw patient Ruth Dorsey, as a referral from the hospital. She is referred for a suspected underlying sleepiness disorder. She is accompanied by her mother today. She is a 49 year old right-handed woman with an underlying medical history of migraines and morbid obesity, who was in the hospital from 05/18/2016 through 05/19/2016 for 2 episodes of loss of consciousness, found down. She had on one occasion subsequent confusion, no witnessed convulsive event, no bowel or bladder incontinence. Workup in the hospital included brain MRI on 05/18/2016, with and without contrast, which was reported as normal. She had an EEG on 05/19/2016, which was reported as normal in the awake and drowsy states. UDS was negative for 05/18/2016. Blood work for hypercoagulable state was negative.  No Hx of seizures per mom or issues with sleepiness or insomnia as a child. No FHx of narcolepsy. Her father has obstructive sleep apnea. Patient endorses daytime sleepiness but no sleep attacks, has never fallen asleep driving, denies falling asleep standing up or while talking to someone. Bedtime is fairly consistent around 10 PM and wakeup time around 6 AM. She has nocturia usually once per night. She has had some morning headaches recently and has a history of migraines, currently using Motrin or Fioricet as needed. Epworth sleepiness score is 14 out of 24, fatigue score is 48 out of 63. She is the youngest of 4 children. She is single and lives with a roommate. She works as a Software engineer. She is a nonsmoker, drinks alcohol occasionally, about once a week, denies illicit drug use and drinks caffeine in the form of coffee, usually 1 cup per day and occasional sodas, none daily. Her weight has remained fairly stable.  She does not endorse any cataplexy, sleep attacks, hypnagogic or hypnopompic hallucinations or sleep paralysis. She recently completed a Holter monitor, results are pending. She had an echocardiogram last month which showed overall benign findings, moderate aortic regurgitation, EF of 65-70, no wall motion abnormalities. She has discussed with her primary care physician the possibility of seeing a cardiologist next. When she was found down, she had no injuries, no laceration, no bruising. She did not have full recollection of the events prior to the collapse. She had to syncopal events, about 2-1/2 weeks apart. Currently, she feels at baseline, denies any significant headache, one-sided weakness or numbness, dizziness or vertigo, lightheadedness. She denies restless leg symptoms.   Her Past Medical History Is Significant For: Past Medical History:  Diagnosis Date  . Asthma   . Migraines     Her Past Surgical History Is Significant For: Past Surgical History:  Procedure Laterality Date  . ARTHROSCOPIC REPAIR ACL    . NO PAST SURGERIES      Her Family History Is Significant For: Family History  Problem Relation Age of Onset  . Alcohol abuse      parent  . Heart attack Paternal Grandfather   . Diabetes Paternal Aunt   . Hyperlipidemia Father   . Hypertension Father   . Stroke Maternal Uncle   . Breast cancer Paternal Grandmother   . Breast cancer Maternal Grandmother     Her Social History Is Significant For: Social History   Social History  . Marital status: Single    Spouse name: N/A  . Number  of children: N/A  . Years of education: N/A   Occupational History  . Teacher      [employer].     Social History Main Topics  . Smoking status: Never Smoker  . Smokeless tobacco: Never Used  . Alcohol use No  . Drug use: Yes    Frequency: 2.0 times per week  . Sexual activity: Not Currently   Other Topics Concern  . None   Social History Narrative   Walks her dog.no  regular caffeine.      Her Allergies Are:  No Known Allergies:   Her Current Medications Are:  Outpatient Encounter Prescriptions as of 06/28/2016  Medication Sig  . albuterol (PROVENTIL HFA;VENTOLIN HFA) 108 (90 BASE) MCG/ACT inhaler Inhale 2 puffs into the lungs every 6 (six) hours as needed.  . butalbital-acetaminophen-caffeine (FIORICET, ESGIC) 50-325-40 MG tablet Take 1 tablet by mouth every 4 (four) hours as needed for headache.  . ibuprofen (ADVIL,MOTRIN) 400 MG tablet Take 1 tablet (400 mg total) by mouth every 6 (six) hours as needed for headache.   No facility-administered encounter medications on file as of 06/28/2016.   :  Review of Systems:  Out of a complete 14 point review of systems, all are reviewed and negative with the exception of these symptoms as listed below: Review of Systems  Neurological:       Pt presents today to discuss "passing out spells." Pt says that she has passed out and landed on her face twice. Pt does endorse snoring but has never had a sleep study.  Epworth Sleepiness Scale 0= would never doze 1= slight chance of dozing 2= moderate chance of dozing 3= high chance of dozing  Sitting and reading: 2 Watching TV: 1 Sitting inactive in a public place (ex. Theater or meeting): 2 As a passenger in a car for an hour without a break: 1 Lying down to rest in the afternoon: 3 Sitting and talking to someone: 2 Sitting quietly after lunch (no alcohol): 2 In a car, while stopped in traffic: 1 Total: 14    Objective:  Neurologic Exam  Physical Exam Physical Examination:   Vitals:   06/28/16 1618  BP: (!) 143/90  Pulse: 84    General Examination: The patient is a very pleasant 26 y.o. female in no acute distress. She appears well-developed and well-nourished and well groomed. Mildly anxious.   HEENT: Normocephalic, atraumatic, pupils are equal, round and reactive to light and accommodation. Extraocular tracking is good without limitation to  gaze excursion or nystagmus noted. Normal smooth pursuit is noted. Hearing is grossly intact. Tympanic membranes are clear bilaterally. Face is symmetric with normal facial animation and normal facial sensation. Speech is clear with no dysarthria noted. There is no hypophonia. There is no lip, neck/head, jaw or voice tremor. Neck is supple with full range of passive and active motion. There are no carotid bruits on auscultation. Oropharynx exam reveals: mild mouth dryness, good dental hygiene and moderate airway crowding, due to smaller airway entry, larger uvula, tonsils in place, about 2+ bilaterally. Mallampati is class II. Neck circumference is 16 inches.  Chest: Clear to auscultation without wheezing, rhonchi or crackles noted.  Heart: S1+S2+0, regular and normal without murmurs, rubs or gallops noted.   Abdomen: Soft, non-tender and non-distended with normal bowel sounds appreciated on auscultation.  Extremities: There is no pitting edema in the distal lower extremities bilaterally. Pedal pulses are intact.  Skin: Warm and dry without trophic changes noted.  Musculoskeletal: exam  reveals no obvious joint deformities, tenderness or joint swelling or erythema.   Neurologically:  Mental status: The patient is awake, alert and oriented in all 4 spheres. Her immediate and remote memory, attention, language skills and fund of knowledge are appropriate. There is no evidence of aphasia, agnosia, apraxia or anomia. Speech is clear with normal prosody and enunciation. Thought process is linear. Mood is normal and affect is normal.  Cranial nerves II - XII are as described above under HEENT exam. In addition: shoulder shrug is normal with equal shoulder height noted. Motor exam: Normal bulk, strength and tone is noted. There is no drift, tremor or rebound. Romberg is negative. Reflexes are 2+ throughout. Babinski: Toes are downgoing. Fine motor skills and coordination: intact with normal finger taps,  normal hand movements, normal rapid alternating patting, normal foot taps and normal foot agility.  Cerebellar testing: No dysmetria or intention tremor on finger to nose testing. Heel to shin is unremarkable bilaterally. There is no truncal or gait ataxia.  Sensory exam: intact to light touch, pinprick, vibration, temperature sense in the upper and lower extremities.  Gait, station and balance: She stands easily. No veering to one side is noted. No leaning to one side is noted. Posture is age-appropriate and stance is narrow based. Gait shows normal stride length and normal pace. No problems turning are noted. Tandem walk is unremarkable.                Assessment and Plan:  In summary, Bronte Sabado is a very pleasant 26 y.o.-year old female  with an underlying medical history of migraines and morbid obesity, who was in the hospital from 05/18/2016 through 05/19/2016 for 2 episodes of loss of consciousness, being found down, with some lapse in memory. She had w/u in the hospital which I reviewed. Physical exam and neurological exam are nonfocal which is reassuring. I'm not sure how to explain these spells, she is encouraged to consider cardiac evaluation. Her history is not telltale for narcolepsy. She does have daytime somnolence, snoring, and a physical exam concerning for underlying obstructive sleep apnea. I would like to proceed with further testing in the form of nighttime sleep study to exclude obstructive sleep apnea and if her nocturnal polysomnogram is negative for OSA or significant intrinsic sleep disorder such as PLMD, we will proceed with daytime nap study testing. I explained the sleep test procedure to the patient and her mother in detail. From the description of her episodes, I would doubt that there is a primary sleepiness disorder underlying but we can certainly help with further diagnostic testing. We talked about obstructive sleep apnea and treatments for this. She is advised that we  can certainly try CPAP therapy for sleep apnea and she would be willing to try this, if the need arises. I will see her back after her sleep studies are completed. I answered all the questions today and the patient and her mom were in agreement.

## 2016-06-28 NOTE — Patient Instructions (Signed)
We will look into your severe sleepiness/recent spells: I would like for you to come back for an overnight sleep study during which we will monitor your night time sleep and we will do nap study testing the next day: 5 scheduled 20 min nap opportunities, every 2 hours. We will remind you to stay awake in between naps. Please do not increase your caffeine intake. You will have to be off of stimulant or antidepressant medication in preparation for the sleep studies. Also off of fioricet.

## 2016-06-29 LAB — IRON: Iron: 29 ug/dL — ABNORMAL LOW (ref 40–190)

## 2016-06-29 LAB — FERRITIN: FERRITIN: 12 ng/mL (ref 10–154)

## 2016-10-12 ENCOUNTER — Ambulatory Visit (INDEPENDENT_AMBULATORY_CARE_PROVIDER_SITE_OTHER): Payer: Managed Care, Other (non HMO) | Admitting: Family Medicine

## 2016-10-12 VITALS — BP 140/84 | HR 70 | Ht 62.0 in | Wt 237.0 lb

## 2016-10-12 DIAGNOSIS — Z111 Encounter for screening for respiratory tuberculosis: Secondary | ICD-10-CM

## 2016-10-12 NOTE — Patient Instructions (Signed)
Patient was in for a PPD placement today along with paperwork to be completed. She was given 0.1 ml of Tuberculin on the left lower arm. Patient denied any complaints. She will come back on Friday to get PPD read and pick up paperwork. Immunizations are attached to paperwork.  Awilda Covin,CMA

## 2016-10-12 NOTE — Progress Notes (Signed)
Patient was in for a PPD placement today along with paperwork to be completed. She was given 0.1 ml of Tuberculin on the left lower arm. Patient denied any complaints. She will come back on Friday to get PPD read and pick up paperwork. Immunizations are attached to paperwork.  Rhonda Cunningham,CMA

## 2016-10-14 ENCOUNTER — Ambulatory Visit (INDEPENDENT_AMBULATORY_CARE_PROVIDER_SITE_OTHER): Payer: Managed Care, Other (non HMO) | Admitting: Family Medicine

## 2016-10-14 VITALS — BP 144/69 | HR 68 | Wt 237.0 lb

## 2016-10-14 DIAGNOSIS — Z111 Encounter for screening for respiratory tuberculosis: Secondary | ICD-10-CM | POA: Diagnosis not present

## 2016-10-14 DIAGNOSIS — R030 Elevated blood-pressure reading, without diagnosis of hypertension: Secondary | ICD-10-CM | POA: Diagnosis not present

## 2016-10-14 LAB — TB SKIN TEST
Induration: 0 mm
TB Skin Test: NEGATIVE

## 2016-10-14 NOTE — Progress Notes (Signed)
Agree with above. Pt to schedule f/u for BP [name], MD

## 2016-10-14 NOTE — Progress Notes (Signed)
   Subjective:    Patient ID: Ruth Dorsey, female    DOB: [birth year], 26 y.o.   MRN: [id number]  HPI  Ruth Dorsey is here today for a PPD read. Denies any complications from the injection.   Elevated blood pressure - Ruth Dorsey has elevated blood pressure today. Denies chest pain, shortness of breath, headaches or dizziness.   Review of Systems     Objective:   Physical Exam        Assessment & Plan:  Screening for TB - Negative results of PPD with 0 mm induration.   Elevated blood pressure - First check of BP was 156/74 and second check of BP 5 minutes later 144/69. Patient advised to schedule an appointment with Dr Madilyn Fireman to address the elevated blood pressure. She was advised to call us back if she starts having any symptoms.

## 2018-05-16 IMAGING — MR MR HEAD WO/W CM
10 of 12 series · 34 of 48 positions shown · IV contrast (20 MH)
Comparison: None.

CLINICAL DATA: 25 y/o F; history of migraine headaches with
multiple episodes found down without recollection of the events.

EXAM:
MRI HEAD WITHOUT AND WITH CONTRAST
TECHNIQUE: Multiplanar, multiecho pulse sequences of the brain and surrounding
structures were obtained without and with intravenous contrast.
CONTRAST:  20mL MULTIHANCE GADOBENATE DIMEGLUMINE 529 MG/ML IV SOLN

[Series 3: DWI · axial · 3.0mm · 0.94mm/px · z∈[-71,+73]mm · 9 of 98 slices shown (1 of 2)]
[im 1/98]
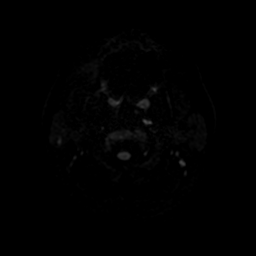
[im 13/98]
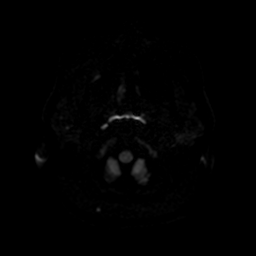
[im 25/98]
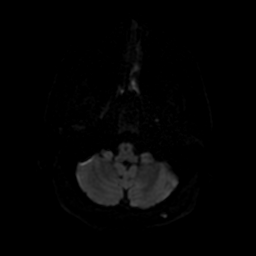
[im 37/98]
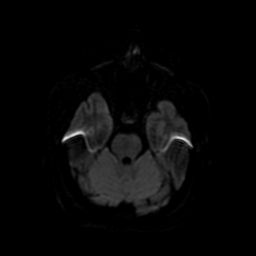
[im 49/98]
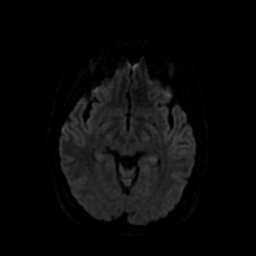
[im 61/98]
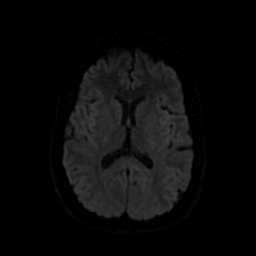
[im 73/98]
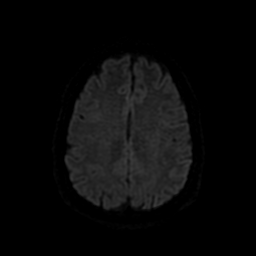
[im 85/98]
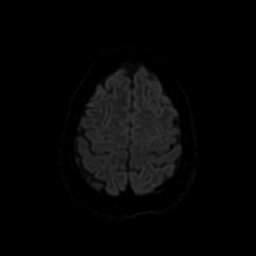
[im 98/98]
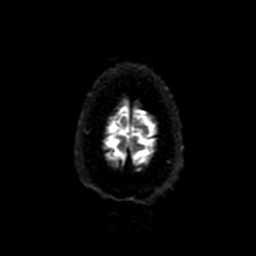

[Series 4: T2 · axial · 5.0mm · 0.47mm/px · z∈[-71,+73]mm · 2 of 25 slices shown]
[im 1/25]
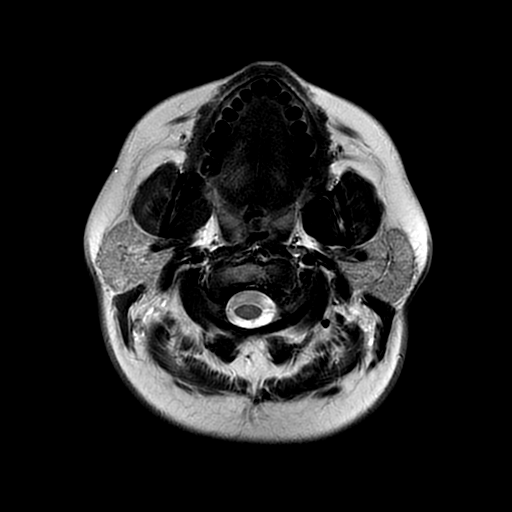
[im 25/25]
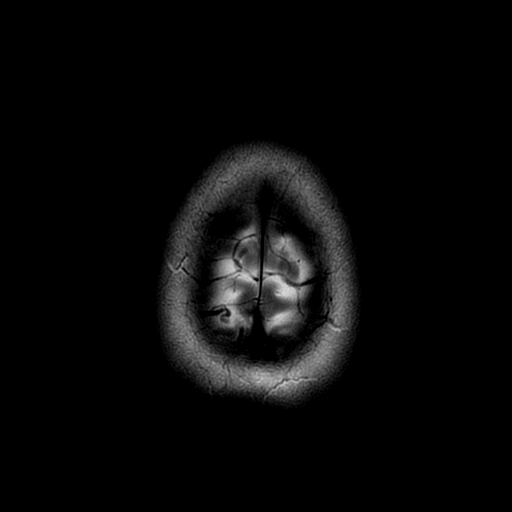

[Series 5: DWI · coronal · 4.0mm · 0.94mm/px · 6 of 66 slices shown (2 of 2)]
[im 1/66]
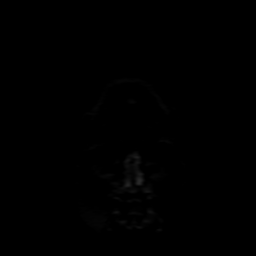
[im 14/66]
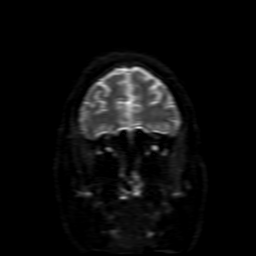
[im 27/66]
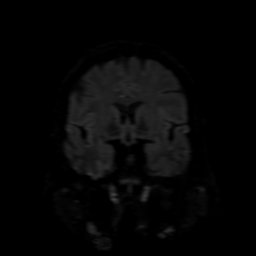
[im 40/66]
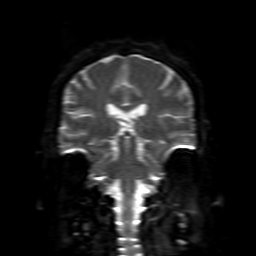
[im 53/66]
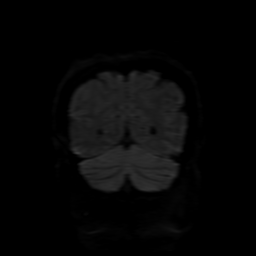
[im 66/66]
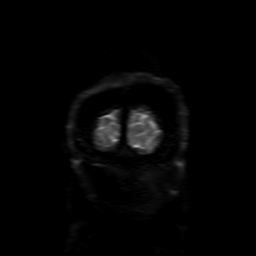

[Series 6: FLAIR · axial · 5.0mm · 0.47mm/px · z∈[-71,+73]mm · 2 of 25 slices shown (1 of 2)]
[im 1/25]
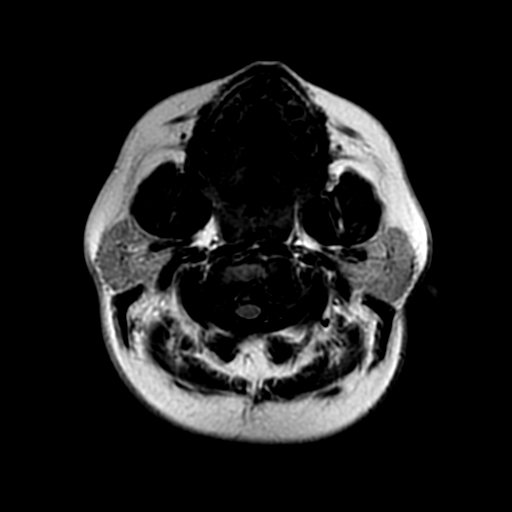
[im 25/25]
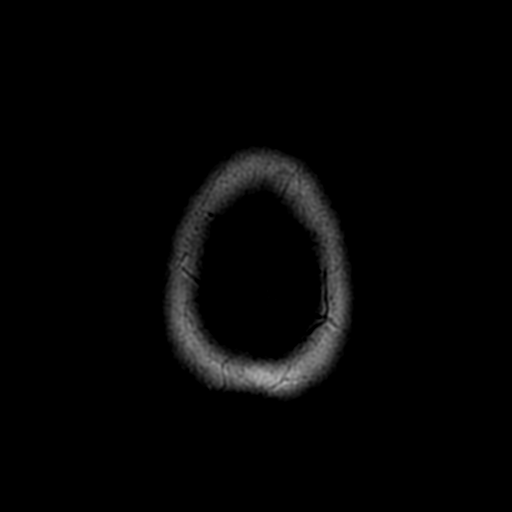

[Series 7: (person_name) · axial · 3.0mm · 0.47mm/px · z∈[-72,-51]mm · 2 of 100 slices shown]
[im 1/100]
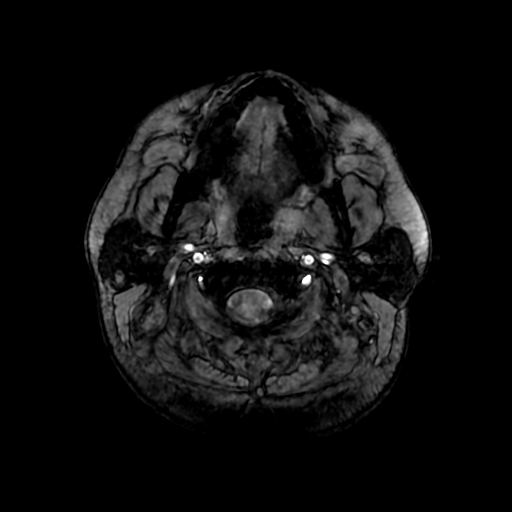
[im 15/100]
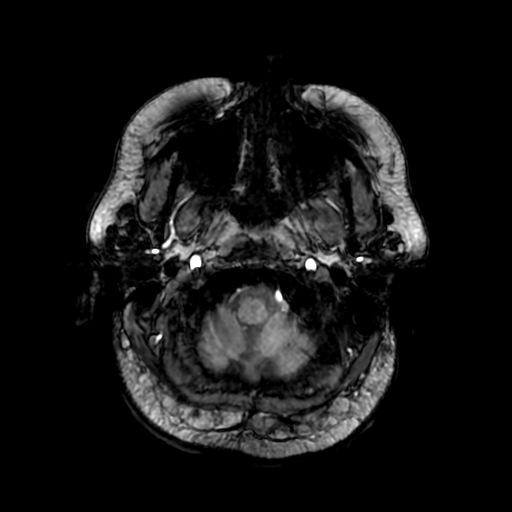

[Series 8: FLAIR · sagittal · 5.0mm · 0.47mm/px · 2 of 23 slices shown (2 of 2)]
[im 1/23]
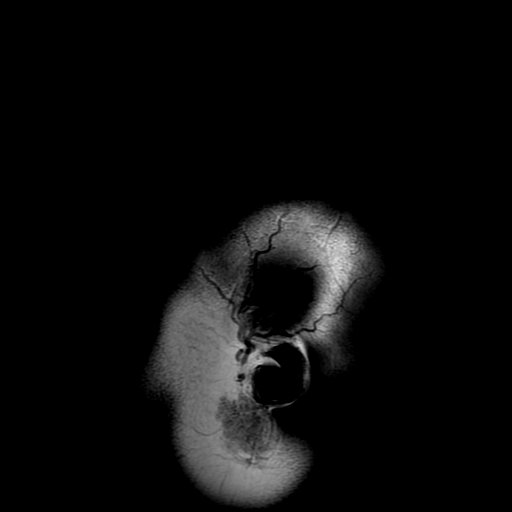
[im 23/23]
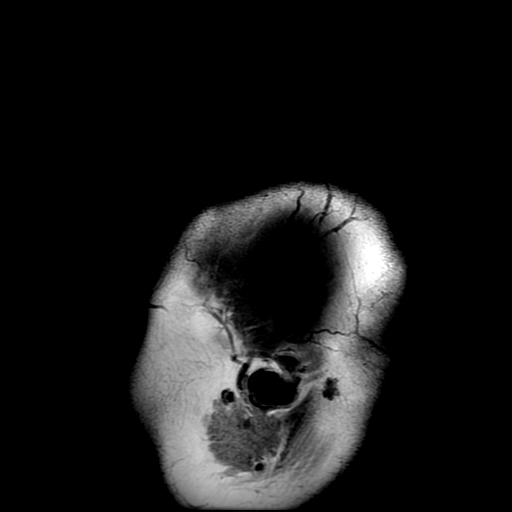

[Series 10: T2 post-contrast · coronal · 5.0mm · 0.39mm/px · 2 of 28 slices shown]
[im 1/28]
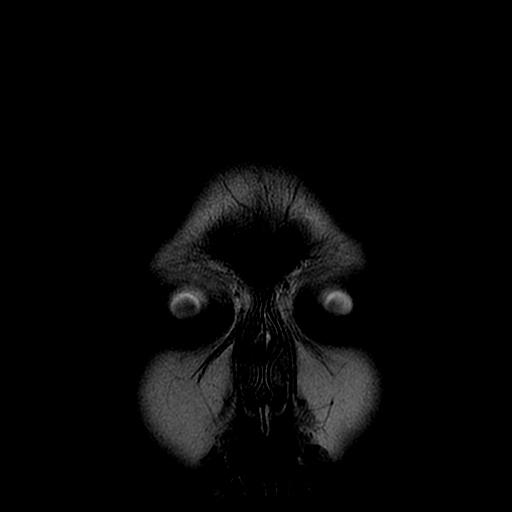
[im 28/28]
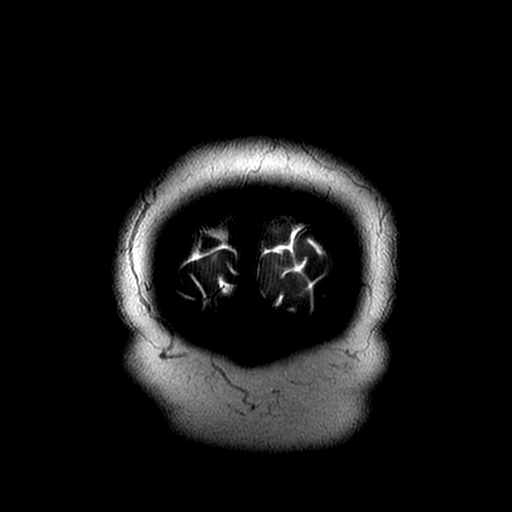

[Series 12: T1 · coronal · 5.0mm · 0.39mm/px · 2 of 28 slices shown]
[im 1/28]
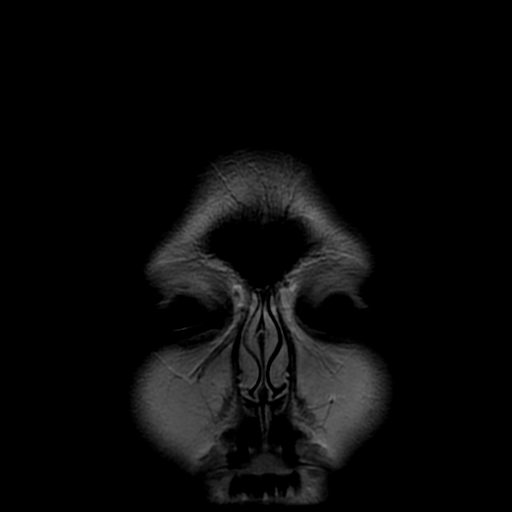
[im 28/28]
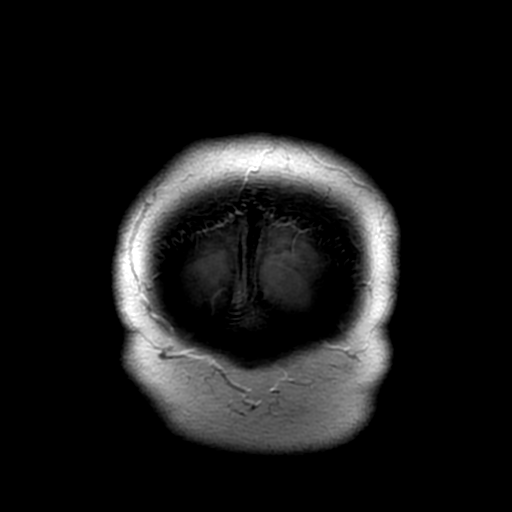

[Series 350: ADC · axial · 3.0mm · 0.94mm/px · z∈[-71,+73]mm · 4 of 49 slices shown (1 of 2)]
[im 1/49]
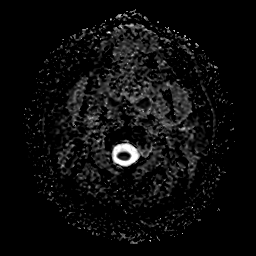
[im 17/49]
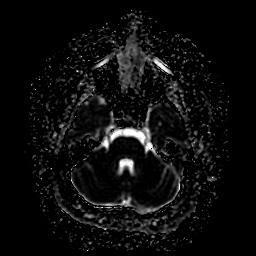
[im 33/49]
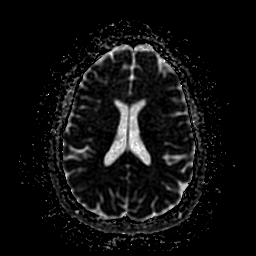
[im 49/49]
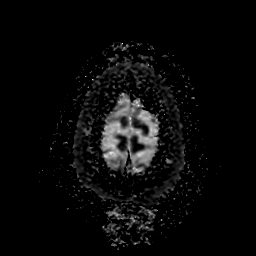

[Series 550: ADC · coronal · 4.0mm · 0.94mm/px · 3 of 33 slices shown (2 of 2)]
[im 1/33]
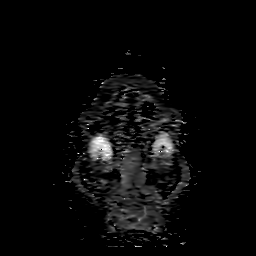
[im 17/33]
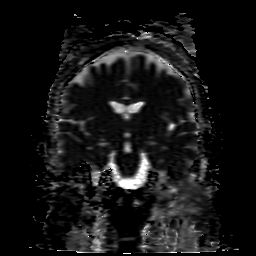
[im 33/33]
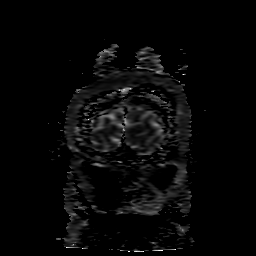

[34 of 48 positions shown; findings below may reference images not displayed]

FINDINGS: Brain: No acute infarction, hemorrhage, hydrocephalus, extra-axial
collection or mass lesion. Normal midline structures including
morphologically normal pituitary gland, complete corpus callosum,
and complete vermis. No disorder of cortical formation, heterotopia,
or dysplasia is identified. Normal myelination. Hippocampi are
symmetric in size and signal. No abnormal enhancement of the brain.

Vascular: Normal flow voids.

Skull and upper cervical spine: Normal marrow signal.

Sinuses/Orbits: Negative.

Other: None.
IMPRESSION: Normal MRI of the brain with and without intravenous contrast

By: Alia Tiger M.D.

## 2018-07-19 ENCOUNTER — Telehealth: Payer: Managed Care, Other (non HMO) | Admitting: Physician Assistant

## 2018-07-19 DIAGNOSIS — J020 Streptococcal pharyngitis: Secondary | ICD-10-CM

## 2018-07-19 MED ORDER — AMOXICILLIN 875 MG PO TABS: 875.0000 mg | ORAL_TABLET | Freq: Two times a day (BID) | ORAL | 0 refills | Status: DC

## 2018-07-19 NOTE — Progress Notes (Signed)
We are sorry that you are not feeling well.  Here is how we plan to help!  Based on what you have shared with me it is likely that you have strep pharyngitis.  Strep pharyngitis is inflammation and infection in the back of the throat.  This is an infection cause by bacteria and is treated with antibiotics.  I have prescribed Amoxicillin 875 mg -- 1 tablet twice daily x 10 days. For throat pain, we recommend over the counter oral pain relief medications such as acetaminophen or aspirin, or anti-inflammatory medications such as ibuprofen or naproxen sodium. Topical treatments such as oral throat lozenges or sprays may be used as needed. Strep infections are not as easily transmitted as other respiratory infections, however we still recommend that you avoid close contact with loved ones, especially the very young and elderly.  Remember to wash your hands thoroughly throughout the day as this is the number one way to prevent the spread of infection and wipe down door knobs and counters with disinfectant.   Home Care:  Only take medications as instructed by your medical team.  Complete the entire course of an antibiotic.  Do not take these medications with alcohol.  A steam or ultrasonic humidifier can help congestion.  You can place a towel over your head and breathe in the steam from hot water coming from a faucet.  Avoid close contacts especially the very young and the elderly.  Cover your mouth when you cough or sneeze.  Always remember to wash your hands.  Get Help Right Away If:  You develop worsening fever or sinus pain.  You develop a severe head ache or visual changes.  Your symptoms persist after you have completed your treatment plan.  Make sure you  Understand these instructions.  Will watch your condition.  Will get help right away if you are not doing well or get worse.  Your e-visit answers were reviewed by a board certified advanced clinical practitioner to complete  your personal care plan.  Depending on the condition, your plan could have included both over the counter or prescription medications.  If there is a problem please reply  once you have received a response from your provider.  Your safety is important to [place].  If you have drug allergies check your prescription carefully.    You can use MyChart to ask questions about today's visit, request a non-urgent call back, or ask for a work or school excuse for 24 hours related to this e-Visit. If it has been greater than 24 hours you will need to follow up with your provider, or enter a new e-Visit to address those concerns.  You will get an e-mail in the next two days asking about your experience.  I hope that your e-visit has been valuable and will speed your recovery. Thank you for using e-visits.

## 2018-07-19 NOTE — Progress Notes (Signed)
I have spent 5 minutes in review of e-visit questionnaire, review and updating patient chart, medical decision making and response to patient.   Margarethe Virgen Cody Teodor Prater, PA-C    

## 2018-12-13 ENCOUNTER — Telehealth: Payer: Managed Care, Other (non HMO) | Admitting: Physician Assistant

## 2018-12-13 DIAGNOSIS — J020 Streptococcal pharyngitis: Secondary | ICD-10-CM

## 2018-12-13 MED ORDER — AMOXICILLIN 875 MG PO TABS: 875.0000 mg | ORAL_TABLET | Freq: Two times a day (BID) | ORAL | 0 refills | Status: DC

## 2018-12-13 NOTE — Progress Notes (Signed)
I have spent 5 minutes in review of e-visit questionnaire, review and updating patient chart, medical decision making and response to patient.   Dhillon Comunale Cody Miklo Aken, PA-C    

## 2018-12-13 NOTE — Progress Notes (Signed)

## 2020-02-04 ENCOUNTER — Telehealth: Payer: Self-pay | Admitting: Emergency Medicine

## 2020-02-04 DIAGNOSIS — J029 Acute pharyngitis, unspecified: Secondary | ICD-10-CM

## 2020-02-04 MED ORDER — CLINDAMYCIN HCL 300 MG PO CAPS: 300.0000 mg | ORAL_CAPSULE | Freq: Three times a day (TID) | ORAL | 0 refills | Status: AC

## 2020-02-04 NOTE — Progress Notes (Signed)
Time spent: 10 min  Time spent: 10 min  We are sorry that you are not feeling well.  Here is how we plan to help!  Based on what you have shared with me it is likely that you have strep pharyngitis.  Strep pharyngitis is inflammation and infection in the back of the throat.  This is an infection cause by bacteria and is treated with antibiotics.  I have prescribed Clindamycin 300 mg three times a day for 10 days. For throat pain, we recommend over the counter oral pain relief medications such as acetaminophen or aspirin, or anti-inflammatory medications such as ibuprofen or naproxen sodium. Topical treatments such as oral throat lozenges or sprays may be used as needed. Strep infections are not as easily transmitted as other respiratory infections, however we still recommend that you avoid close contact with loved ones, especially the very young and elderly.  Remember to wash your hands thoroughly throughout the day as this is the number one way to prevent the spread of infection and wipe down door knobs and counters with disinfectant.   Home Care:  Only take medications as instructed by your medical team.  Complete the entire course of an antibiotic.  Do not take these medications with alcohol.  A steam or ultrasonic humidifier can help congestion.  You can place a towel over your head and breathe in the steam from hot water coming from a faucet.  Avoid close contacts especially the very young and the elderly.  Cover your mouth when you cough or sneeze.  Always remember to wash your hands.  Get Help Right Away If:  You develop worsening fever or sinus pain.  You develop a severe head ache or visual changes.  Your symptoms persist after you have completed your treatment plan.  Make sure you  Understand these instructions.  Will watch your condition.  Will get help right away if you are not doing well or get worse.  Your e-visit answers were reviewed by a board certified  advanced clinical practitioner to complete your personal care plan.  Depending on the condition, your plan could have included both over the counter or prescription medications.  If there is a problem please reply  once you have received a response from your provider.  Your safety is important to [place].  If you have drug allergies check your prescription carefully.    You can use MyChart to ask questions about today's visit, request a non-urgent call back, or ask for a work or school excuse for 24 hours related to this e-Visit. If it has been greater than 24 hours you will need to follow up with your provider, or enter a new e-Visit to address those concerns.  You will get an e-mail in the next two days asking about your experience.  I hope that your e-visit has been valuable and will speed your recovery. Thank you for using e-visits.

## 2020-07-21 ENCOUNTER — Telehealth: Payer: Self-pay | Admitting: Physician Assistant

## 2020-07-21 DIAGNOSIS — J4521 Mild intermittent asthma with (acute) exacerbation: Secondary | ICD-10-CM

## 2020-07-21 DIAGNOSIS — J069 Acute upper respiratory infection, unspecified: Secondary | ICD-10-CM

## 2020-07-21 MED ORDER — PREDNISONE 20 MG PO TABS: 40.0000 mg | ORAL_TABLET | Freq: Every day | ORAL | 0 refills | Status: DC

## 2020-07-21 MED ORDER — BENZONATATE 100 MG PO CAPS: 100.0000 mg | ORAL_CAPSULE | Freq: Three times a day (TID) | ORAL | 0 refills | Status: DC | PRN

## 2020-07-21 MED ORDER — ALBUTEROL SULFATE HFA 108 (90 BASE) MCG/ACT IN AERS: 2.0000 | INHALATION_SPRAY | Freq: Four times a day (QID) | RESPIRATORY_TRACT | 0 refills | Status: DC | PRN

## 2020-07-21 NOTE — Progress Notes (Signed)
I have spent 5 minutes in review of e-visit questionnaire, review and updating patient chart, medical decision making and response to patient.   Kennidy Lamke Cody Larah Kuntzman, PA-C    

## 2020-07-21 NOTE — Progress Notes (Signed)
We are sorry that you are not feeling well.  Here is how we plan to help!  Based on your presentation I believe you most likely have A cough due to a virus.  This is called viral bronchitis and is best treated by rest, plenty of fluids and control of the cough.  You may use Ibuprofen or Tylenol as directed to help your symptoms. I am concerned that it is causing a mild exacerbation of asthma/reactive airway. As such I am making sure you have a refill on file of your albuterol inhaler and sending in a 5-day burst of prednisone.     In addition you may use A prescription cough medication called Tessalon Perles 100mg . You may take 1-2 capsules every 8 hours as needed for your cough. This prescription has also been sent to the pharmacy.    From your responses in the eVisit questionnaire you describe inflammation in the upper respiratory tract which is causing a significant cough.  This is commonly called Bronchitis and has four common causes:    Allergies  Viral Infections  Acid Reflux  Bacterial Infection Allergies, viruses and acid reflux are treated by controlling symptoms or eliminating the cause. An example might be a cough caused by taking certain blood pressure medications. You stop the cough by changing the medication. Another example might be a cough caused by acid reflux. Controlling the reflux helps control the cough.  USE OF BRONCHODILATOR ("RESCUE") INHALERS: There is a risk from using your bronchodilator too frequently.  The risk is that over-reliance on a medication which only relaxes the muscles surrounding the breathing tubes can reduce the effectiveness of medications prescribed to reduce swelling and congestion of the tubes themselves.  Although you feel brief relief from the bronchodilator inhaler, your asthma may actually be worsening with the tubes becoming more swollen and filled with mucus.  This can delay other crucial treatments, such as oral steroid medications. If you need  to use a bronchodilator inhaler daily, several times per day, you should discuss this with your provider.  There are probably better treatments that could be used to keep your asthma under control.     HOME CARE . Only take medications as instructed by your medical team. . Complete the entire course of an antibiotic. . Drink plenty of fluids and get plenty of rest. . Avoid close contacts especially the very young and the elderly . Cover your mouth if you cough or cough into your sleeve. . Always remember to wash your hands . A steam or ultrasonic humidifier can help congestion.   GET HELP RIGHT AWAY IF: . You develop worsening fever. . You become short of breath . You cough up blood. . Your symptoms persist after you have completed your treatment plan MAKE SURE YOU   Understand these instructions.  Will watch your condition.  Will get help right away if you are not doing well or get worse.  Your e-visit answers were reviewed by a board certified advanced clinical practitioner to complete your personal care plan.  Depending on the condition, your plan could have included both over the counter or prescription medications. If there is a problem please reply  once you have received a response from your provider. Your safety is important to [place].  If you have drug allergies check your prescription carefully.    You can use MyChart to ask questions about today's visit, request a non-urgent call back, or ask for a work or school excuse for 24  hours related to this e-Visit. If it has been greater than 24 hours you will need to follow up with your provider, or enter a new e-Visit to address those concerns. You will get an e-mail in the next two days asking about your experience.  I hope that your e-visit has been valuable and will speed your recovery. Thank you for using e-visits.

## 2020-07-22 ENCOUNTER — Telehealth: Payer: Self-pay | Admitting: Family

## 2020-07-22 DIAGNOSIS — R21 Rash and other nonspecific skin eruption: Secondary | ICD-10-CM

## 2020-07-22 DIAGNOSIS — T7840XA Allergy, unspecified, initial encounter: Secondary | ICD-10-CM

## 2020-07-22 NOTE — Progress Notes (Signed)
E Visit for Rash  We are sorry that you are not feeling well. Here is how we plan to help!  It appears you are having an allergic reaction.  I recommend you take Benadryl 25 mg - 50 mg every 4 hours to control the symptoms but if they last over 24 hours it is best that you see an office based provider for follow up.  I would continue the prednisone and benadryl. I would stop the tessalon. It is very unlikely the rash is coming from the albuterol or prednisone. Also, some times viruses can causes rashes.    HOME CARE:   Take cool showers and avoid direct sunlight.  Apply cool compress or wet dressings.  Take a bath in an oatmeal bath.  Sprinkle content of one Aveeno packet under running faucet with comfortably warm water.  Bathe for 15-20 minutes, 1-2 times daily.  Pat dry with a towel. Do not rub the rash.  Use hydrocortisone cream.  Take an antihistamine like Benadryl for widespread rashes that itch.  The adult dose of Benadryl is 25-50 mg by mouth 4 times daily.  Caution:  This type of medication may cause sleepiness.  Do not drink alcohol, drive, or operate dangerous machinery while taking antihistamines.  Do not take these medications if you have prostate enlargement.  Read package instructions thoroughly on all medications that you take.  GET HELP RIGHT AWAY IF:   Symptoms don't go away after treatment.  Severe itching that persists.  If you rash spreads or swells.  If you rash begins to smell.  If it blisters and opens or develops a yellow-brown crust.  You develop a fever.  You have a sore throat.  You become short of breath.  MAKE SURE YOU:  Understand these instructions. Will watch your condition. Will get help right away if you are not doing well or get worse.  Thank you for choosing an e-visit. Your e-visit answers were reviewed by a board certified advanced clinical practitioner to complete your personal care plan. Depending upon the condition, your plan  could have included both over the counter or prescription medications. Please review your pharmacy choice. Be sure that the pharmacy you have chosen is open so that you can pick up your prescription now.  If there is a problem you may message your provider in [place] to have the prescription routed to another pharmacy. Your safety is important to [place]. If you have drug allergies check your prescription carefully.  For the next 24 hours, you can use MyChart to ask questions about today's visit, request a non-urgent call back, or ask for a work or school excuse from your e-visit provider. You will get an email in the next two days asking about your experience. I hope that your e-visit has been valuable and will speed your recovery.    Approximately 5 minutes was spent documenting and reviewing patient's chart.

## 2020-08-20 ENCOUNTER — Telehealth: Payer: Self-pay | Admitting: Physician Assistant

## 2020-08-20 DIAGNOSIS — B9689 Other specified bacterial agents as the cause of diseases classified elsewhere: Secondary | ICD-10-CM

## 2020-08-20 DIAGNOSIS — J028 Acute pharyngitis due to other specified organisms: Secondary | ICD-10-CM

## 2020-08-20 MED ORDER — AMOXICILLIN 500 MG PO TABS: 500.0000 mg | ORAL_TABLET | Freq: Two times a day (BID) | ORAL | 0 refills | Status: DC

## 2020-08-20 NOTE — Progress Notes (Signed)
I have spent 5 minutes in review of e-visit questionnaire, review and updating patient chart, medical decision making and response to patient.   Ellaina Schuler Cody Kristell Wooding, PA-C    

## 2020-08-20 NOTE — Progress Notes (Signed)
We are sorry that you are not feeling well.  Here is how we plan to help!  Based on what you have shared with me it is likely that you have strep pharyngitis.  Strep pharyngitis is inflammation and infection in the back of the throat.  This is an infection cause by bacteria and is treated with antibiotics.  I have prescribed Amoxicillin 500 mg twice a day for 10 days. For throat pain, we recommend over the counter oral pain relief medications such as acetaminophen or aspirin, or anti-inflammatory medications such as ibuprofen or naproxen sodium. Topical treatments such as oral throat lozenges or sprays may be used as needed. Strep infections are not as easily transmitted as other respiratory infections, however we still recommend that you avoid close contact with loved ones, especially the very young and elderly.  Remember to wash your hands thoroughly throughout the day as this is the number one way to prevent the spread of infection and wipe down door knobs and counters with disinfectant.   Home Care: Only take medications as instructed by your medical team. Complete the entire course of an antibiotic. Do not take these medications with alcohol. A steam or ultrasonic humidifier can help congestion.  You can place a towel over your head and breathe in the steam from hot water coming from a faucet. Avoid close contacts especially the very young and the elderly. Cover your mouth when you cough or sneeze. Always remember to wash your hands.  Get Help Right Away If: You develop worsening fever or sinus pain. You develop a severe head ache or visual changes. Your symptoms persist after you have completed your treatment plan.  Make sure you Understand these instructions. Will watch your condition. Will get help right away if you are not doing well or get worse.  Your e-visit answers were reviewed by a board certified advanced clinical practitioner to complete your personal care plan.  Depending on  the condition, your plan could have included both over the counter or prescription medications.  If there is a problem please reply  once you have received a response from your provider.  Your safety is important to [place].  If you have drug allergies check your prescription carefully.    You can use MyChart to ask questions about today's visit, request a non-urgent call back, or ask for a work or school excuse for 24 hours related to this e-Visit. If it has been greater than 24 hours you will need to follow up with your provider, or enter a new e-Visit to address those concerns.  You will get an e-mail in the next two days asking about your experience.  I hope that your e-visit has been valuable and will speed your recovery. Thank you for using e-visits.

## 2021-01-06 ENCOUNTER — Telehealth: Payer: Self-pay | Admitting: Physician Assistant

## 2021-01-06 DIAGNOSIS — J039 Acute tonsillitis, unspecified: Secondary | ICD-10-CM

## 2021-01-06 DIAGNOSIS — J029 Acute pharyngitis, unspecified: Secondary | ICD-10-CM

## 2021-01-06 MED ORDER — AMOXICILLIN 500 MG PO CAPS: 500.0000 mg | ORAL_CAPSULE | Freq: Two times a day (BID) | ORAL | 0 refills | Status: AC

## 2021-01-06 NOTE — Progress Notes (Signed)
I have spent 5 minutes in review of e-visit questionnaire, review and updating patient chart, medical decision making and response to patient.   Deaken Jurgens Cody Misael Mcgaha, PA-C    

## 2021-01-06 NOTE — Progress Notes (Signed)

## 2022-04-18 ENCOUNTER — Telehealth: Payer: Self-pay | Admitting: Physician Assistant

## 2022-04-18 DIAGNOSIS — J020 Streptococcal pharyngitis: Secondary | ICD-10-CM

## 2022-04-18 MED ORDER — AMOXICILLIN 500 MG PO CAPS: 500.0000 mg | ORAL_CAPSULE | Freq: Two times a day (BID) | ORAL | 0 refills | Status: AC

## 2022-04-18 NOTE — Progress Notes (Signed)

## 2022-11-08 ENCOUNTER — Telehealth: Payer: BC Managed Care – PPO | Admitting: Family Medicine

## 2022-11-08 DIAGNOSIS — J029 Acute pharyngitis, unspecified: Secondary | ICD-10-CM | POA: Diagnosis not present

## 2022-11-08 MED ORDER — AMOXICILLIN 500 MG PO TABS: 500.0000 mg | ORAL_TABLET | Freq: Two times a day (BID) | ORAL | 0 refills | Status: AC

## 2022-11-08 MED ORDER — LIDOCAINE VISCOUS HCL 2 % MT SOLN: 15.0000 mL | OROMUCOSAL | 0 refills | Status: AC | PRN

## 2022-11-08 NOTE — Progress Notes (Signed)
E-Visit for Sore Throat - Strep Symptoms  We are sorry that you are not feeling well.  Here is how we plan to help!  Based on what you have shared with me it is likely that you have strep pharyngitis.  Strep pharyngitis is inflammation and infection in the back of the throat.  This is an infection cause by bacteria and is treated with antibiotics.  I have prescribed Amoxicillin 500 mg twice a day for 10 days and 2% Viscous Lidocaine 5 ml gargle and swallow every 4 hours as needed for throat pain. For throat pain, we recommend over the counter oral pain relief medications such as acetaminophen or aspirin, or anti-inflammatory medications such as ibuprofen or naproxen sodium. Topical treatments such as oral throat lozenges or sprays may be used as needed. Strep infections are not as easily transmitted as other respiratory infections, however we still recommend that you avoid close contact with loved ones, especially the very young and elderly.  Remember to wash your hands thoroughly throughout the day as this is the number one way to prevent the spread of infection and wipe down door knobs and counters with disinfectant.   Home Care: Only take medications as instructed by your medical team. Complete the entire course of an antibiotic. Do not take these medications with alcohol. A steam or ultrasonic humidifier can help congestion.  You can place a towel over your head and breathe in the steam from hot water coming from a faucet. Avoid close contacts especially the very young and the elderly. Cover your mouth when you cough or sneeze. Always remember to wash your hands.  Get Help Right Away If: You develop worsening fever or sinus pain. You develop a severe head ache or visual changes. Your symptoms persist after you have completed your treatment plan.  Make sure you Understand these instructions. Will watch your condition. Will get help right away if you are not doing well or get  worse.   Thank you for choosing an e-visit.  Your e-visit answers were reviewed by a board certified advanced clinical practitioner to complete your personal care plan. Depending upon the condition, your plan could have included both over the counter or prescription medications.  Please review your pharmacy choice. Make sure the pharmacy is open so you can pick up prescription now. If there is a problem, you may contact your provider through MyChart messaging and have the prescription routed to another pharmacy.  Your safety is important to us. If you have drug allergies check your prescription carefully.   For the next 24 hours you can use MyChart to ask questions about today's visit, request a non-urgent call back, or ask for a work or school excuse. You will get an email in the next two days asking about your experience. I hope that your e-visit has been valuable and will speed your recovery.  I provided 5 minutes of non face-to-face time during this encounter for chart review, medication and order placement, as well as and documentation.    

## 2023-04-09 ENCOUNTER — Telehealth: Payer: Self-pay | Admitting: Nurse Practitioner

## 2023-04-09 DIAGNOSIS — J4521 Mild intermittent asthma with (acute) exacerbation: Secondary | ICD-10-CM

## 2023-04-09 DIAGNOSIS — J069 Acute upper respiratory infection, unspecified: Secondary | ICD-10-CM

## 2023-04-09 MED ORDER — ALBUTEROL SULFATE HFA 108 (90 BASE) MCG/ACT IN AERS: 2.0000 | INHALATION_SPRAY | Freq: Four times a day (QID) | RESPIRATORY_TRACT | 0 refills | Status: AC | PRN

## 2023-04-09 MED ORDER — FLUTICASONE PROPIONATE 50 MCG/ACT NA SUSP: 2.0000 | Freq: Every day | NASAL | 6 refills | Status: AC

## 2023-04-09 MED ORDER — PSEUDOEPH-BROMPHEN-DM 30-2-10 MG/5ML PO SYRP: 5.0000 mL | ORAL_SOLUTION | Freq: Four times a day (QID) | ORAL | 0 refills | Status: AC | PRN

## 2023-04-09 NOTE — Progress Notes (Signed)

## 2023-04-09 NOTE — Progress Notes (Signed)
 I have spent 5 minutes in review of e-visit questionnaire, review and updating patient chart, medical decision making and response to patient.   Claiborne Rigg, NP
